# Patient Record
Sex: Female | Born: 1985
Health system: Southern US, Community
[De-identification: ages and names within clinical notes are randomized; demographics above are authoritative.]

## PROBLEM LIST (undated history)

## (undated) ENCOUNTER — Emergency Department: Discharge status: Absent without leave | Payer: Managed Care, Other (non HMO)

## (undated) DIAGNOSIS — R6250 Unspecified lack of expected normal physiological development in childhood: Secondary | ICD-10-CM

## (undated) DIAGNOSIS — T7840XA Allergy, unspecified, initial encounter: Secondary | ICD-10-CM

## (undated) DIAGNOSIS — F32A Depression, unspecified: Secondary | ICD-10-CM

## (undated) DIAGNOSIS — F329 Major depressive disorder, single episode, unspecified: Secondary | ICD-10-CM

## (undated) HISTORY — DX: Allergy, unspecified, initial encounter: T78.40XA

## (undated) HISTORY — DX: Unspecified lack of expected normal physiological development in childhood: R62.50

## (undated) HISTORY — DX: Depression, unspecified: F32.A

## (undated) HISTORY — DX: Major depressive disorder, single episode, unspecified: F32.9

---

## 2003-01-01 ENCOUNTER — Encounter: Payer: Self-pay | Admitting: Family Medicine

## 2006-10-19 ENCOUNTER — Encounter: Payer: Self-pay | Admitting: Family Medicine

## 2009-03-03 ENCOUNTER — Encounter: Payer: Self-pay | Admitting: Family Medicine

## 2009-12-14 ENCOUNTER — Encounter: Payer: Self-pay | Admitting: Family Medicine

## 2009-12-29 ENCOUNTER — Ambulatory Visit: Payer: Self-pay | Admitting: Family Medicine

## 2009-12-29 DIAGNOSIS — J309 Allergic rhinitis, unspecified: Secondary | ICD-10-CM | POA: Insufficient documentation

## 2009-12-29 DIAGNOSIS — F329 Major depressive disorder, single episode, unspecified: Secondary | ICD-10-CM

## 2009-12-29 DIAGNOSIS — L309 Dermatitis, unspecified: Secondary | ICD-10-CM | POA: Insufficient documentation

## 2009-12-29 DIAGNOSIS — F418 Other specified anxiety disorders: Secondary | ICD-10-CM | POA: Insufficient documentation

## 2009-12-29 DIAGNOSIS — F09 Unspecified mental disorder due to known physiological condition: Secondary | ICD-10-CM | POA: Insufficient documentation

## 2009-12-29 DIAGNOSIS — F3289 Other specified depressive episodes: Secondary | ICD-10-CM | POA: Insufficient documentation

## 2010-02-23 NOTE — Assessment & Plan Note (Signed)
Summary: NEW PT TO EST/CLE   Vital Signs:  Patient profile:   25 year old female Height:      63.5 inches Weight:      153.0 pounds BMI:     26.77 Temp:     98.7 degrees F oral Pulse rate:   72 / minute Pulse rhythm:   regular BP sitting:   110 / 70  (left arm) Cuff size:   regular  Vitals Entered By: Benny Lennert CMA Duncan Dull) (December 29, 2009 9:48 AM)  History of Present Illness: Chief complaint new patient to be established    Diagnosed at young age, age 61 with ... brain developmental injury.. did not get adequate blood when in womb or as a child. Has been recieveing  special assistance. Has recieved Occupation degree from [employer].  Works at [employer] 4 times a week...goes to [facility] classes for adults with disabilities. Low IQ Gross and fine motor skills and diminished some.  Some mild depression...lonely at times, knows she is different. Has not needed medicaiton.  Seeing counsleor every 3 weeks.  Some fatigue, some weight gain in last few years.   Allergies (verified): No Known Drug Allergies  Past History:  Past Medical History: Current Problems:  DEPRESSION (ICD-311)    Past Surgical History: oral surgery wisdom teeth removed 10-23-2009  Family History: mother: arhtritis, renal insufficiency, osteoporosis, early menopause Age 15 father: high cholesterol, GERD sister: high blood pressure mother: ovarian cancer,  died age 29  father: lung cancer and colon cancer, age 73 died  Social History: Occupation: Works Systems developer at [employer].  Going to [employer] for adults with disabilities Exercsing 4 times a week... Healthy eating..fruit and occ veggies, water, limited milkOccupation:  employed  Review of Systems General:  Complains of fatigue; denies fever. CV:  Denies chest pain or discomfort. Resp:  Denies shortness of breath. GI:  Denies abdominal pain. GU:  Denies dysuria.  Physical Exam  General:  overweight female in NAD  Ears:  External  ear exam shows no significant lesions or deformities.  Otoscopic examination reveals clear canals, tympanic membranes are intact bilaterally without bulging, retraction, inflammation or discharge. Hearing is grossly normal bilaterally. Nose:  External nasal examination shows no deformity or inflammation. Nasal mucosa are pink and moist without lesions or exudates. Mouth:  Oral mucosa and oropharynx without lesions or exudates.  Teeth in good repair. Neck:  no carotid bruit or thyromegaly no cervical or supraclavicular lymphadenopathy  Lungs:  Normal respiratory effort, chest expands symmetrically. Lungs are clear to auscultation, no crackles or wheezes. Heart:  Normal rate and regular rhythm. S1 and S2 normal without gallop, murmur, click, rub or other extra sounds. Abdomen:  Bowel sounds positive,abdomen soft and non-tender without masses, organomegaly or hernias noted. Pulses:  R and L posterior tibial pulses are full and equal bilaterally  Extremities:  no edema Skin:  Intact without suspicious lesions or rashes Psych:  Oriented X3, subdued, and poor eye contact.   Mimially interactive, but able to answer clear simple questions.    Impression & Recommendations:  Problem # 1:  DEVELOPMENTAL DELAY (ICD-315.9) Reviewed previous records and her current abilities. She stays very active with work and school.   Problem # 2:  DEPRESSION (ICD-311) Mild. Encouraged her mother to look into supplemental activities for her...info on social services given. Continue counsleing, no medication needed.   Complete Medication List: 1)  Cetirizine Hcl 10 Mg Tabs (Cetirizine hcl) .... One tablet daily 2)  Tri-previfem  0.18/0.215/0.25 Mg-35 Mcg Tabs (Norgestim-eth estrad triphasic) .... One tablet daily 3)  Fluticasone Propionate 50 Mcg/act Susp (Fluticasone propionate) .... As needed 4)  Naproxen Sodium 275 Mg Tabs (Naproxen sodium) .... As needed  Patient Instructions: 1)  Start multivitamin. 2)  Caclium  and vit D 600 mg, 400IU daily. 3)   Make appt for CPX in February 2012.    Orders Added: 1)  New Patient Level II [99202]    Current Allergies (reviewed today): No known allergies   Flu Vaccine Result Date:  11/24/2009 Flu Vaccine Result:  given Flu Vaccine Next Due:  1 yr TD Result Date:  01/25/1996 TD Result:  given TD Next Due:  10 yr PAP Result Date:  12/29/2009 PAP Result:  normal PAP Next Due:  2 yr

## 2010-02-23 NOTE — Letter (Signed)
Summary: '05,07'08/[facility]  '05,07'08/[facility]   Imported By: Lester Semmes 12/14/2009 12:15:57  _____________________________________________________________________  External Attachment:    Type:   Image     Comment:   External Document

## 2010-02-23 NOTE — Letter (Signed)
Summary: Growth Chart/[facility]  Growth Chart/[facility]   Imported By: Lester Weston 12/14/2009 12:20:48  _____________________________________________________________________  External Attachment:    Type:   Image     Comment:   External Document

## 2010-02-23 NOTE — Miscellaneous (Signed)
Summary: [facility]   Imported By: Lester Whiteland 12/14/2009 12:22:42  _____________________________________________________________________  External Attachment:    Type:   Image     Comment:   External Document

## 2010-06-14 LAB — HM PAP SMEAR: HM Pap smear: NORMAL

## 2012-06-13 ENCOUNTER — Ambulatory Visit (INDEPENDENT_AMBULATORY_CARE_PROVIDER_SITE_OTHER): Payer: BC Managed Care – PPO | Admitting: Internal Medicine

## 2012-06-13 ENCOUNTER — Encounter: Payer: Self-pay | Admitting: Internal Medicine

## 2012-06-13 VITALS — BP 112/68 | HR 55 | Temp 97.4°F | Resp 14 | Ht 63.0 in | Wt 152.0 lb

## 2012-06-13 DIAGNOSIS — L738 Other specified follicular disorders: Secondary | ICD-10-CM

## 2012-06-13 DIAGNOSIS — Z6825 Body mass index (BMI) 25.0-25.9, adult: Secondary | ICD-10-CM

## 2012-06-13 DIAGNOSIS — L853 Xerosis cutis: Secondary | ICD-10-CM

## 2012-06-13 DIAGNOSIS — R6250 Unspecified lack of expected normal physiological development in childhood: Secondary | ICD-10-CM

## 2012-06-13 DIAGNOSIS — E663 Overweight: Secondary | ICD-10-CM

## 2012-06-13 MED ORDER — FLUTICASONE PROPIONATE 50 MCG/ACT NA SUSP: 2.0000 | Freq: Every day | NASAL | Status: DC

## 2012-06-13 MED ORDER — CETIRIZINE HCL 10 MG PO TABS: 10.0000 mg | ORAL_TABLET | Freq: Every day | ORAL | Status: DC

## 2012-06-13 MED ORDER — NORGESTIM-ETH ESTRAD TRIPHASIC 0.18/0.215/0.25 MG-35 MCG PO TABS: ORAL_TABLET | ORAL | Status: DC

## 2012-06-13 NOTE — Progress Notes (Signed)
Patient ID: Ashlee Graves, female   DOB: 1985/03/30, 27 y.o.   MRN: [id number]   Patient Active Problem List   Diagnosis Date Noted  . Xerosis of skin 06/14/2012  . Overweight (BMI 25.0-29.9) 06/14/2012  . DEPRESSION 12/29/2009  . DEVELOPMENTAL DELAY 12/29/2009  . ALLERGIC RHINITIS 12/29/2009  . DERMATITIS, SEBORRHEIC 12/29/2009    Subjective:  CC:   Chief Complaint  Patient presents with  . Establish Care    HPI:   Ashlee Graves is a 27 y.o. female who presents as a new patient to establish primary care with the chief complaint of  New patient ,  Formerly Dr. Threasa Beards patient.   Referred by Jonita Albee,  Who  works as an NP at [employer].     Takes zurtec for allergic rhinitis but allergies have gotten worse and she is having itching.  Not worse after a bath or shower .  Actually gets relief with bathing., has not changed detergents    She has been learning disabled since  birth,  Gross motor skills and learning skills delayed .  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   Goes to [employer] [facility] [employer]  For adults with disability.  Hx provided by mom    History of heavy cramping and bleeding controlled with OCPs,  Patient has no history of sexual activity.  Had the Gardasil vaccine.    Has a preoccupation with weight and currently unahppy and wants to lose weight.    Mom notes loss of self control if at a buffet.  Weight fluctuates between current wt and 130 self initiated with dieting .  No bulemia.  She used to run but having some foot drop, had a fall down the stairs.  Tripped [place] board [place] the beach and fracture a toe.   Exercises for 40 minutes 4 days per week    Past Medical History  Diagnosis Date  . Depression   . Allergy   . Developmental delay, moderate     History reviewed. No pertinent past surgical history.  Family History  Problem Relation Age of Onset  . Hypertension Father   . Hyperlipidemia  Father   . Cancer Maternal Grandmother   . Cancer Maternal Grandfather   . Cancer Paternal Grandfather     History   Social History  . Marital Status: Single    Spouse Name: N/A    Number of Children: N/A  . Years of Education: N/A   Occupational History  . Not on file.   Social History Main Topics  . Smoking status: Never Smoker   . Smokeless tobacco: Never Used  . Alcohol Use: Yes     Comment: occasionally  . Drug Use: No  . Sexually Active: Not on file   Other Topics Concern  . Not on file   Social History Narrative  . No narrative on file        Review of Systems:   Patient denies headache, fevers, malaise, unintentional weight loss, skin rash, eye pain, sinus congestion and sinus pain, sore throat, dysphagia,  hemoptysis , cough, dyspnea, wheezing, chest pain, palpitations, orthopnea, edema, abdominal pain, nausea, melena, diarrhea, constipation, flank pain, dysuria, hematuria, urinary  Frequency, nocturia, numbness, tingling, seizures,  Focal weakness, Loss of consciousness,  Tremor, insomnia, depression, anxiety, and suicidal ideation.     Objective:  BP 112/68  Pulse 55  Temp(Src) 97.4 F (36.3 C) (Oral)  Resp 14  Ht 5\' 3"  (1.6 m)  Wt 152 lb (68.947 kg)  BMI 26.93 kg/m2  SpO2 96%  LMP 06/13/2012  General appearance: alert, cooperative and appears stated age Ears: normal TM's and external ear canals both ears Throat: lips, mucosa, and tongue normal; teeth and gums normal Neck: no adenopathy, no carotid bruit, supple, symmetrical, trachea midline and thyroid not enlarged, symmetric, no tenderness/mass/nodules Back: symmetric, no curvature. ROM normal. No CVA tenderness. Lungs: clear to auscultation bilaterally Heart: regular rate and rhythm, S1, S2 normal, no murmur, click, rub or gallop Abdomen: soft, non-tender; bowel sounds normal; no masses,  no organomegaly Pulses: 2+ and symmetric Skin: Skin color, texture, turgor normal. No rashes or  lesions Lymph nodes: Cervical, supraclavicular, and axillary nodes normal.  Assessment and Plan:  Xerosis of skin No rash seen.  Soaps and moisturizers discussed.  Aveeno or Eucerin recommended,  Screen for DM and thyroids   DEVELOPMENTAL DELAY Lives with parents  works AT [employer] and attends adult daycare   Overweight (BMI 25.0-29.9) I have addressed  BMI and recommended a low glycemic index diet utilizing smaller more frequent meals to increase metabolism.  I have also recommended that patient start exercising with a goal of 30 minutes of aerobic exercise a minimum of 5 days per week. Screening for lipid disorders, thyroid and diabetes to be done today.     Updated Medication List Outpatient Encounter Prescriptions as of 06/13/2012  Medication Sig Dispense Refill  . cetirizine (ZYRTEC) 10 MG tablet Take 1 tablet (10 mg total) by mouth daily.  90 tablet  3  . fluticasone (FLONASE) 50 MCG/ACT nasal spray Place 2 sprays into the nose daily.  16 g  11  . Norgestimate-Ethinyl Estradiol Triphasic (ORTHO TRI-CYCLEN, 28,) 0.18/0.215/0.25 MG-35 MCG tablet 1 tablet daily  1 Package  11  . [DISCONTINUED] cetirizine (ZYRTEC) 10 MG tablet Take 10 mg by mouth daily.      . [DISCONTINUED] fluticasone (FLONASE) 50 MCG/ACT nasal spray Place 2 sprays into the nose daily.      . [DISCONTINUED] Norgestim-Eth Estrad Triphasic (ORTHO TRI-CYCLEN, 28, PO) Take 1 tablet by mouth daily.       No facility-administered encounter medications on file as of 06/13/2012.     Orders Placed This Encounter  Procedures  . HM PAP SMEAR    No Follow-up on file.

## 2012-06-13 NOTE — Patient Instructions (Addendum)
Avoid perfumed soaps and moisturizers because they actually dry out the skin.  Ask Tammy if she can draw labs (CMET,  CBC w diff,  TSH.,  Fasting lipid,  ) if not,  Make appt for fasting labs.    Try adding a daily moisturizer to your routine to see if the itching improves (Aveeno and Eucerin )   Try eating strawberries, raspberries and blackberries with whipped cream but no cake!  Much better than bananas, watermelon pineapple and grapes.    This is  One version of a  "Low GI"  Diet:  It will still lower your blood sugars and allow you to lose 4 to 8  lbs  per month if you follow it carefully.  Your goal with exercise is a minimum of 30 minutes of aerobic exercise 5 days per week (Walking does not count once it becomes easy!)    All of the foods can be found at grocery stores and in bulk at [employer].  The Atkins protein bars and shakes are available in more varieties at Target, WalMart and Lowe's Foods.     7 AM Breakfast:  Choose from the following:  Low carbohydrate Protein  Shakes (I recommend the EAS AdvantEdge "Carb Control" shakes  Or the low carb shakes by Atkins.    2.5 carbs   Arnold's "Sandwhich Thin"toasted  w/ peanut butter (no jelly: about 20 net carbs  "Bagel Thin" with cream cheese and salmon: about 20 carbs   a scrambled egg/bacon/cheese burrito made with Mission's "carb balance" whole wheat tortilla  (about 10 net carbs )   Avoid cereal and bananas, oatmeal and cream of wheat and grits. They are loaded with carbohydrates!   10 AM: high protein snack  Protein bar by Atkins (the snack size, under 200 cal, usually < 6 net carbs).    A stick of cheese:  Around 1 carb,  100 cal     Dannon Light n Fit [place] Yogurt  (80 cal, 8 carbs)  Other so called "protein bars" and Greek yogurts tend to be loaded with carbohydrates.  Remember, in food advertising, the word "energy" is synonymous for " carbohydrate."  Lunch:   A Sandwich using the bread choices listed, Can use any  Eggs,   lunchmeat, grilled meat or canned tuna), avocado, regular mayo/mustard  and cheese.  A Salad using blue cheese, ranch,  Goddess or vinagrette,  No croutons or "confetti" and no "candied nuts" but regular nuts OK.   No pretzels or chips.  Pickles and miniature sweet peppers are a good low carb alternative that provide a "crunch"  The bread is the only source of carbohydrate in a sandwich and  can be decreased by trying some of these alternatives to traditional loaf bread  Joseph's makes a pita bread and a flat bread that are 50 cal and 4 net carbs available at BJs and WalMart.  This can be toasted to use with hummous as well  Toufayan makes a low carb flatbread that's 100 cal and 9 net carbs available at [employer] and [employer] makes 2 sizes of  Low carb whole wheat tortilla  (The large one is 210 cal and 6 net carbs) Avoid "Low fat dressings, as well as Reyne Dumas and [address] dressings They are loaded with sugar!   3 PM/ Mid day  Snack:  Consider  1 ounce of  almonds, walnuts, pistachios, pecans, peanuts,  Macadamia nuts or a nut medley.  Avoid "granola"; the  dried cranberries and raisins are loaded with carbohydrates. Mixed nuts as long as there are no raisins,  cranberries or dried fruit.     6 PM  Dinner:     Meat/fowl/fish with a green salad, and either broccoli, cauliflower, green beans, spinach, brussel sprouts or  Lima beans. DO NOT BREAD THE PROTEIN!!      There is a low carb pasta by Dreamfield's that is acceptable and tastes great: only 5 digestible carbs/serving.( All grocery stores but BJs carry it )  Try Kai Levins Angelo's chicken piccata or chicken or eggplant parm over low carb pasta.(Lowes and BJs)   Clifton Custard Sanchez's "Carnitas" (pulled pork, no sauce,  0 carbs) or his beef pot roast to make a dinner burrito (at BJ's)  Pesto over low carb pasta (bj's sells a good quality pesto in the center refrigerated section of the deli   Whole wheat pasta is still full of digestible  carbs and  Not as low in glycemic index as Dreamfield's.   Brown rice is still rice,  So skip the rice and noodles if you eat [place] or [place] (or at least limit to 1/2 cup)  9 PM snack :   Breyer's "low carb" fudgsicle or  ice cream bar (Carb Smart line), or  Weight Watcher's ice cream bar , or another "no sugar added" ice cream;  a serving of fresh berries/cherries with whipped cream   Cheese or DANNON'S LlGHT N FIT GREEK YOGURT  Avoid bananas, pineapple, grapes  and watermelon on a regular basis because they are high in sugar.  THINK OF THEM AS DESSERT  Remember that snack Substitutions should be less than 10 NET carbs per serving and meals < 20 carbs. Remember to subtract fiber grams to get the "net carbs."

## 2012-06-14 ENCOUNTER — Encounter: Payer: Self-pay | Admitting: Internal Medicine

## 2012-06-14 DIAGNOSIS — R6250 Unspecified lack of expected normal physiological development in childhood: Secondary | ICD-10-CM | POA: Insufficient documentation

## 2012-06-14 DIAGNOSIS — E669 Obesity, unspecified: Secondary | ICD-10-CM | POA: Insufficient documentation

## 2012-06-14 DIAGNOSIS — E66811 Obesity, class 1: Secondary | ICD-10-CM | POA: Insufficient documentation

## 2012-06-14 DIAGNOSIS — L981 Factitial dermatitis: Secondary | ICD-10-CM | POA: Insufficient documentation

## 2012-06-14 DIAGNOSIS — E663 Overweight: Secondary | ICD-10-CM | POA: Insufficient documentation

## 2012-06-14 HISTORY — DX: Obesity, class 1: E66.811

## 2012-06-14 NOTE — Assessment & Plan Note (Signed)
Lives with parents  works AT [employer] and attends adult daycare

## 2012-06-14 NOTE — Assessment & Plan Note (Signed)
No rash seen.  Soaps and moisturizers discussed.  Aveeno or Eucerin recommended,  Screen for DM and thyroids

## 2012-06-14 NOTE — Assessment & Plan Note (Signed)
I have addressed  BMI and recommended a low glycemic index diet utilizing smaller more frequent meals to increase metabolism.  I have also recommended that patient start exercising with a goal of 30 minutes of aerobic exercise a minimum of 5 days per week. Screening for lipid disorders, thyroid and diabetes to be done today.   

## 2012-07-06 LAB — CBC AND DIFFERENTIAL
HEMATOCRIT: 37 % (ref 36–46)
HEMOGLOBIN: 12.7 g/dL (ref 12.0–16.0)
Platelets: 280 10*3/uL (ref 150–399)
WBC: 6.3 10^3/mL

## 2012-07-06 LAB — LIPID PANEL
Cholesterol: 158 mg/dL (ref 0–200)
HDL: 56 mg/dL (ref 35–70)
LDL CALC: 89 mg/dL
TRIGLYCERIDES: 65 mg/dL (ref 40–160)

## 2012-07-06 LAB — HEPATIC FUNCTION PANEL
ALT: 10 U/L (ref 7–35)
AST: 19 U/L (ref 13–35)
Alkaline Phosphatase: 40 U/L (ref 25–125)
Bilirubin, Total: 0.3 mg/dL

## 2012-07-06 LAB — TSH: TSH: 4.45 u[IU]/mL (ref 0.41–5.90)

## 2012-07-06 LAB — BASIC METABOLIC PANEL
BUN: 9 mg/dL (ref 4–21)
Creatinine: 0.6 mg/dL (ref 0.5–1.1)
Glucose: 78 mg/dL
POTASSIUM: 4.4 mmol/L (ref 3.4–5.3)
Sodium: 137 mmol/L (ref 137–147)

## 2012-07-12 ENCOUNTER — Encounter: Payer: Self-pay | Admitting: *Deleted

## 2012-07-12 ENCOUNTER — Telehealth: Payer: Self-pay | Admitting: Internal Medicine

## 2012-07-12 NOTE — Telephone Encounter (Signed)
All labs normal,. Including thyroid function, CBC, Vit D  and cholesterol

## 2012-07-12 NOTE — Telephone Encounter (Signed)
Letter mailed

## 2013-05-04 ENCOUNTER — Other Ambulatory Visit: Payer: Self-pay | Admitting: Internal Medicine

## 2013-05-06 NOTE — Telephone Encounter (Signed)
Appt 06/21/13 

## 2013-06-13 ENCOUNTER — Encounter: Payer: Medicare Other | Admitting: Internal Medicine

## 2013-06-21 ENCOUNTER — Encounter: Payer: Self-pay | Admitting: Internal Medicine

## 2013-06-21 ENCOUNTER — Ambulatory Visit (INDEPENDENT_AMBULATORY_CARE_PROVIDER_SITE_OTHER): Payer: BC Managed Care – PPO | Admitting: Internal Medicine

## 2013-06-21 ENCOUNTER — Telehealth: Payer: Self-pay | Admitting: Internal Medicine

## 2013-06-21 VITALS — BP 130/88 | HR 75 | Temp 97.5°F | Resp 16 | Ht 63.0 in | Wt 154.8 lb

## 2013-06-21 DIAGNOSIS — Z0000 Encounter for general adult medical examination without abnormal findings: Secondary | ICD-10-CM

## 2013-06-21 DIAGNOSIS — J309 Allergic rhinitis, unspecified: Secondary | ICD-10-CM

## 2013-06-21 DIAGNOSIS — F411 Generalized anxiety disorder: Secondary | ICD-10-CM

## 2013-06-21 MED ORDER — AZELASTINE HCL 0.05 % OP SOLN: 1.0000 [drp] | Freq: Two times a day (BID) | OPHTHALMIC | Status: DC

## 2013-06-21 MED ORDER — NORGESTIM-ETH ESTRAD TRIPHASIC 0.18/0.215/0.25 MG-35 MCG PO TABS: ORAL_TABLET | ORAL | Status: DC

## 2013-06-21 MED ORDER — SERTRALINE HCL 50 MG PO TABS: 50.0000 mg | ORAL_TABLET | Freq: Every day | ORAL | Status: DC

## 2013-06-21 MED ORDER — FEXOFENADINE HCL 180 MG PO TABS: 180.0000 mg | ORAL_TABLET | Freq: Every day | ORAL | Status: DC

## 2013-06-21 NOTE — Patient Instructions (Signed)
For your eye allergies,  I have prescribed azelastine drops  To use twice daily  We are also switching from zyrtec to allegra (generic )     Trial of generic zyrtec for anxiety and compulsivity. Start with 1/2 tablet daily in the evneing,  Increase to full tablet after 4 to 6 days

## 2013-06-21 NOTE — Telephone Encounter (Signed)
FYI Pt saw dr Derrel Nip today 06/21/13.  Dr Derrel Nip wanted 4 week follow up Due to being out of town  Caregiver made appointment for 08/09/13

## 2013-06-21 NOTE — Progress Notes (Signed)
Patient ID: Ashlee Graves, female   DOB: [birth year], 28 y.o.   MRN: [id number]   Subjective:     Ashlee Graves is a 27 y.o. female and is here for a comprehensive physical exam. The patient reports problems - as follows: 1) not sexually active  2) allergic rhinitis and conjunctivitis depsite regular use of flonase and zyrtec  3)  Aggressive behavior:  Per her mother,  She has been  stressed out by relationship with her older  sister Ashlee Graves, who has a new boyfriend that [name] does not like /is jealous of.   Sees a counselor Ashlee Graves,  More frequently , but patient's mother wants to discuss ssri therapy bc she has seen her go into a near panic attack and start scratching her arms,  Some complusive and mean spirited behavior.     History   Social History  . Marital Status: Single    Spouse Name: N/A    Number of Children: N/A  . Years of Education: N/A   Occupational History  . Not on file.   Social History Main Topics  . Smoking status: Never Smoker   . Smokeless tobacco: Never Used  . Alcohol Use: Yes     Comment: occasionally  . Drug Use: No  . Sexual Activity: Not on file   Other Topics Concern  . Not on file   Social History Narrative  . No narrative on file   Health Maintenance  Topic Date Due  . Tetanus/tdap  03/06/2002  . Pap Smear  06/13/2013  . Influenza Vaccine  08/24/2013    The following portions of the patient's history were reviewed and updated as appropriate: allergies, current medications, past family history, past medical history, past social history, past surgical history and problem list.  Review of Systems A comprehensive review of systems was negative.   Objective:   BP 130/88  Pulse 75  Temp(Src) 97.5 F (36.4 C) (Oral)  Resp 16  Ht 5\' 3"  (1.6 m)  Wt 154 lb 12 oz (70.194 kg)  BMI 27.42 kg/m2  SpO2 99%  LMP 06/10/2013  General appearance: alert, cooperative and appears stated age Head: Normocephalic, without obvious abnormality,  atraumatic Eyes: conjunctivae/corneas clear. PERRL, EOM's intact. Fundi benign. Ears: normal TM's and external ear canals both ears Nose: Nares normal. Septum midline. Mucosa normal. No drainage or sinus tenderness. Throat: lips, mucosa, and tongue normal; teeth and gums normal Neck: no adenopathy, no carotid bruit, no JVD, supple, symmetrical, trachea midline and thyroid not enlarged, symmetric, no tenderness/mass/nodules Lungs: clear to auscultation bilaterally Breasts: normal appearance, no masses or tenderness Heart: regular rate and rhythm, S1, S2 normal, no murmur, click, rub or gallop Abdomen: soft, non-tender; bowel sounds normal; no masses,  no organomegaly Extremities: extremities normal, atraumatic, no cyanosis or edema Pulses: 2+ and symmetric Skin: Skin color, texture, turgor normal. No rashes or lesions Neurologic: Alert and oriented X 3, normal strength and tone. Normal symmetric reflexes. Normal coordination and gait.   .    Assessment and Plan:   ALLERGIC RHINITIS Adding azelastine eye drops and allegra. Continue flonase  Anxiety state, unspecified Discussed trial of zoloft for her compulsive, aggressive behavior. Return in one month   Visit for preventive health examination Annual comprehensive exam was done including breast, excluding  pelvic and PAP smear. All screenings have been addressed .    Updated Medication List Outpatient Encounter Prescriptions as of 06/21/2013  Medication Sig  . fluticasone (FLONASE) 50 MCG/ACT nasal spray Place 2 sprays into the  nose daily.  . Norgestimate-Ethinyl Estradiol Triphasic (TRI-PREVIFEM) 0.18/0.215/0.25 MG-35 MCG tablet TAKE 1 TABLET BY MOUTH EVERY DAY  . [DISCONTINUED] cetirizine (ZYRTEC) 10 MG tablet Take 1 tablet (10 mg total) by mouth daily.  . [DISCONTINUED] TRI-PREVIFEM 0.18/0.215/0.25 MG-35 MCG tablet TAKE 1 TABLET BY MOUTH EVERY DAY  . azelastine (OPTIVAR) 0.05 % ophthalmic solution Place 1 drop into both eyes 2  (two) times daily.  . fexofenadine (ALLEGRA) 180 MG tablet Take 1 tablet (180 mg total) by mouth daily.  . sertraline (ZOLOFT) 50 MG tablet Take 1 tablet (50 mg total) by mouth daily.

## 2013-06-21 NOTE — Progress Notes (Signed)
Pre-visit discussion using our clinic review tool. No additional management support is needed unless otherwise documented below in the visit note.  

## 2013-06-23 ENCOUNTER — Encounter: Payer: Self-pay | Admitting: Internal Medicine

## 2013-06-23 DIAGNOSIS — Z124 Encounter for screening for malignant neoplasm of cervix: Secondary | ICD-10-CM | POA: Insufficient documentation

## 2013-06-23 DIAGNOSIS — F411 Generalized anxiety disorder: Secondary | ICD-10-CM | POA: Insufficient documentation

## 2013-06-23 NOTE — Assessment & Plan Note (Signed)
Annual comprehensive exam was done including breast, excluding pelvic and PAP smear. All screenings have been addressed .  

## 2013-06-23 NOTE — Assessment & Plan Note (Signed)
Adding azelastine eye drops and allegra. Continue flonase

## 2013-06-23 NOTE — Assessment & Plan Note (Signed)
Discussed trial of zoloft for her compulsive, aggressive behavior. Return in one month

## 2013-06-28 ENCOUNTER — Other Ambulatory Visit: Payer: Self-pay | Admitting: Internal Medicine

## 2013-06-28 LAB — CBC AND DIFFERENTIAL
HCT: 38 % (ref 36–46)
Hemoglobin: 12.5 g/dL (ref 12.0–16.0)
Neutrophils Absolute: 4 /uL
Platelets: 290 10*3/uL (ref 150–399)
WBC: 7.2 10^3/mL

## 2013-06-28 LAB — BASIC METABOLIC PANEL
BUN: 11 mg/dL (ref 4–21)
CREATININE: 0.6 mg/dL (ref 0.5–1.1)
Glucose: 77 mg/dL
Potassium: 4.7 mmol/L (ref 3.4–5.3)
Sodium: 137 mmol/L (ref 137–147)

## 2013-06-28 LAB — LIPID PANEL
CHOLESTEROL: 165 mg/dL (ref 0–200)
HDL: 58 mg/dL (ref 35–70)
LDL Cholesterol: 96 mg/dL
Triglycerides: 56 mg/dL (ref 40–160)

## 2013-06-28 LAB — HEPATIC FUNCTION PANEL
ALK PHOS: 45 U/L (ref 25–125)
ALT: 9 U/L (ref 7–35)
AST: 17 U/L (ref 13–35)
Bilirubin, Total: 0.2 mg/dL

## 2013-06-28 LAB — TSH: TSH: 2.24 u[IU]/mL (ref 0.41–5.90)

## 2013-07-07 ENCOUNTER — Other Ambulatory Visit: Payer: Self-pay | Admitting: Internal Medicine

## 2013-08-09 ENCOUNTER — Ambulatory Visit: Payer: Medicare Other | Admitting: Internal Medicine

## 2013-08-12 ENCOUNTER — Ambulatory Visit (INDEPENDENT_AMBULATORY_CARE_PROVIDER_SITE_OTHER): Payer: BC Managed Care – PPO | Admitting: Internal Medicine

## 2013-08-12 ENCOUNTER — Encounter: Payer: Self-pay | Admitting: Internal Medicine

## 2013-08-12 VITALS — BP 128/92 | HR 65 | Temp 98.5°F | Resp 16 | Ht 63.0 in | Wt 161.8 lb

## 2013-08-12 DIAGNOSIS — F411 Generalized anxiety disorder: Secondary | ICD-10-CM

## 2013-08-12 DIAGNOSIS — R638 Other symptoms and signs concerning food and fluid intake: Secondary | ICD-10-CM

## 2013-08-12 DIAGNOSIS — T50905A Adverse effect of unspecified drugs, medicaments and biological substances, initial encounter: Secondary | ICD-10-CM

## 2013-08-12 DIAGNOSIS — R635 Abnormal weight gain: Secondary | ICD-10-CM

## 2013-08-12 NOTE — Progress Notes (Signed)
Pre-visit discussion using our clinic review tool. No additional management support is needed unless otherwise documented below in the visit note.  

## 2013-08-12 NOTE — Patient Instructions (Addendum)
You   are doing well,  But I want you to stop sneaking food!!!    I want you to spend 45 minutes every day on the [place] After work!  4 days per week  The only snacks you should have are fruit and yogurt    Try making a parfait of the following ingredients:  Blueberries Dannon Lt n Fit Greek yogurt Whipped  Cream  Really yummy!!  Very good for you   I would like you ot lose 5 lbs by the next time I see you in 2 months

## 2013-08-12 NOTE — Progress Notes (Signed)
Patient ID: Ashlee Graves, female   DOB: [birth year], 28 y.o.   MRN: [id number]   Patient Active Problem List   Diagnosis Date Noted  . Weight gain due to medication 08/13/2013  . Anxiety state, unspecified 06/23/2013  . Visit for preventive health examination 06/23/2013  . Xerosis of skin 06/14/2012  . Overweight (BMI 25.0-29.9) 06/14/2012  . DEPRESSION 12/29/2009  . DEVELOPMENTAL DELAY 12/29/2009  . ALLERGIC RHINITIS 12/29/2009  . DERMATITIS, SEBORRHEIC 12/29/2009    Subjective:  CC:   Chief Complaint  Patient presents with  . Follow-up    4 week follow up on zoloft and weight    HPI:   Ashlee Graves is a 28 y.o. female who presents for 4 week follow up on SSRI initiation  Of therapy for hostile/aggressive behavior towards sister's boyfriend,.  She is accompanied by her mother.  Her behavioral issues have resolved and her mother is very pleased with her demeanor .  She is taking just 25 mg zoloft.  No more hostility and acting out.  However, she has gained weight since starting the medication.  She frequently sneaks food  And eating several additional meals daily.    Past Medical History  Diagnosis Date  . Depression   . Allergy   . Developmental delay, moderate     History reviewed. No pertinent past surgical history.     The following portions of the patient's history were reviewed and updated as appropriate: Allergies, current medications, and problem list.    Review of Systems:   Patient denies headache, fevers, malaise, unintentional weight loss, skin rash, eye pain, sinus congestion and sinus pain, sore throat, dysphagia,  hemoptysis , cough, dyspnea, wheezing, chest pain, palpitations, orthopnea, edema, abdominal pain, nausea, melena, diarrhea, constipation, flank pain, dysuria, hematuria, urinary  Frequency, nocturia, numbness, tingling, seizures,  Focal weakness, Loss of consciousness,  Tremor, insomnia, depression, anxiety, and suicidal ideation.      History   Social History  . Marital Status: Single    Spouse Name: N/A    Number of Children: N/A  . Years of Education: N/A   Occupational History  . Not on file.   Social History Main Topics  . Smoking status: Never Smoker   . Smokeless tobacco: Never Used  . Alcohol Use: Yes     Comment: occasionally  . Drug Use: No  . Sexual Activity: Not on file   Other Topics Concern  . Not on file   Social History Narrative  . No narrative on file    Objective:  Filed Vitals:   08/12/13 1714  BP: 128/92  Pulse: 65  Temp: 98.5 F (36.9 C)  Resp: 16     General appearance: alert, cooperative and appears stated age Ears: normal TM's and external ear canals both ears Throat: lips, mucosa, and tongue normal; teeth and gums normal Neck: no adenopathy, no carotid bruit, supple, symmetrical, trachea midline and thyroid not enlarged, symmetric, no tenderness/mass/nodules Back: symmetric, no curvature. ROM normal. No CVA tenderness. Lungs: clear to auscultation bilaterally Heart: regular rate and rhythm, S1, S2 normal, no murmur, click, rub or gallop Abdomen: soft, non-tender; bowel sounds normal; no masses,  no organomegaly Pulses: 2+ and symmetric Skin: Skin color, texture, turgor normal. No rashes or lesions Lymph nodes: Cervical, supraclavicular, and axillary nodes normal.  Assessment and Plan:  Anxiety state, unspecified improved with low dose zoloft ; no changes today  Weight gain due to medication Secondary to zoloft.  counseling given.,  Low glycemic index diet  recommended,    Updated Medication List Outpatient Encounter Prescriptions as of 08/12/2013  Medication Sig  . azelastine (OPTIVAR) 0.05 % ophthalmic solution Place 1 drop into both eyes 2 (two) times daily.  . fexofenadine (ALLEGRA) 180 MG tablet Take 1 tablet (180 mg total) by mouth daily.  . fluticasone (FLONASE) 50 MCG/ACT nasal spray Place 2 sprays into the nose daily.  . Norgestimate-Ethinyl  Estradiol Triphasic (TRI-PREVIFEM) 0.18/0.215/0.25 MG-35 MCG tablet TAKE 1 TABLET BY MOUTH EVERY DAY  . sertraline (ZOLOFT) 50 MG tablet Take 1 tablet (50 mg total) by mouth daily.  . cetirizine (ZYRTEC) 10 MG tablet TAKE 1 TABLET EVERY DAY     No orders of the defined types were placed in this encounter.    Return in about 2 months (around 10/13/2013).

## 2013-08-13 ENCOUNTER — Encounter: Payer: Self-pay | Admitting: Internal Medicine

## 2013-08-13 DIAGNOSIS — F509 Eating disorder, unspecified: Secondary | ICD-10-CM | POA: Insufficient documentation

## 2013-08-13 NOTE — Assessment & Plan Note (Signed)
Secondary to zoloft.  counseling given.,  Low glycemic index diet recommended,

## 2013-08-13 NOTE — Assessment & Plan Note (Signed)
improved with low dose zoloft ; no changes today

## 2013-08-15 ENCOUNTER — Other Ambulatory Visit: Payer: Self-pay | Admitting: Internal Medicine

## 2013-09-10 ENCOUNTER — Other Ambulatory Visit: Payer: Self-pay | Admitting: Internal Medicine

## 2013-10-07 ENCOUNTER — Other Ambulatory Visit: Payer: Self-pay | Admitting: Internal Medicine

## 2013-10-21 ENCOUNTER — Ambulatory Visit (INDEPENDENT_AMBULATORY_CARE_PROVIDER_SITE_OTHER): Payer: BC Managed Care – PPO | Admitting: Internal Medicine

## 2013-10-21 ENCOUNTER — Encounter: Payer: Self-pay | Admitting: Internal Medicine

## 2013-10-21 VITALS — BP 138/84 | HR 64 | Temp 98.1°F | Resp 16 | Ht 63.0 in | Wt 159.8 lb

## 2013-10-21 DIAGNOSIS — L981 Factitial dermatitis: Secondary | ICD-10-CM

## 2013-10-21 DIAGNOSIS — F411 Generalized anxiety disorder: Secondary | ICD-10-CM

## 2013-10-21 DIAGNOSIS — E663 Overweight: Secondary | ICD-10-CM

## 2013-10-21 DIAGNOSIS — Z79899 Other long term (current) drug therapy: Secondary | ICD-10-CM

## 2013-10-21 MED ORDER — PHENTERMINE HCL 37.5 MG PO TABS: 37.5000 mg | ORAL_TABLET | Freq: Every day | ORAL | Status: DC

## 2013-10-21 NOTE — Progress Notes (Signed)
Pre-visit discussion using our clinic review tool. No additional management support is needed unless otherwise documented below in the visit note.  

## 2013-10-21 NOTE — Progress Notes (Signed)
Patient ID: Ashlee Graves, female   DOB: 1985-03-29, 28 y.o.   MRN: [id number]   Patient Active Problem List   Diagnosis Date Noted  . Weight gain due to medication 08/13/2013  . Generalized anxiety disorder 06/23/2013  . Visit for preventive health examination 06/23/2013  . Excoriation, neurotic 06/14/2012  . Overweight (BMI 25.0-29.9) 06/14/2012  . DEPRESSION 12/29/2009  . DEVELOPMENTAL DELAY 12/29/2009  . ALLERGIC RHINITIS 12/29/2009  . DERMATITIS, SEBORRHEIC 12/29/2009    Subjective:  CC:   Chief Complaint  Patient presents with  . Follow-up    weight    HPI:   Ashlee Graves is a 28 y.o. female who presents for Follow up on generalized anxiety disorder and overweight, complicated by eating disorder and learning disability, Patient is accompanied by her mother,  She has lost 32 lbs since her last visit but continues to struggle with overeating and hoarding of food.   Has episode of N/v Saturday night  After eating an [employer] chicken sandwhich. Started early Sunday morning, which Resolved by breakfast Sunday morning with no residual symptoms.     Occasional  headaches reported   Frontal location.  Often complains of them when she is confronted by noisy children.  .Denies blurred vision, sinus pain, auras, and nausea.   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  Denies chest pain and joint pain,  No history of syncope.    Continues to have episodes of anxiety brought on by confrontation with her sister who has moved in with her boyfriend and as a result has less time to spend with patient,  Mother notes that her outward sign of anxiety is  Neurotic itching resulting in scratching of arms and legs. Discussed increasing her zoloft from 25 mg to 50 mg    Past Medical History  Diagnosis Date  . Depression   . Allergy   . Developmental delay, moderate     History  reviewed. No pertinent past surgical history.     The following portions of the patient's history were reviewed and updated as appropriate: Allergies, current medications, and problem list.    Review of Systems:   Patient denies headache, fevers, malaise, unintentional weight loss, skin rash, eye pain, sinus congestion and sinus pain, sore throat, dysphagia,  hemoptysis , cough, dyspnea, wheezing, chest pain, palpitations, orthopnea, edema, abdominal pain, nausea, melena, diarrhea, constipation, flank pain, dysuria, hematuria, urinary  Frequency, nocturia, numbness, tingling, seizures,  Focal weakness, Loss of consciousness,  Tremor, insomnia, depression, anxiety, and suicidal ideation.     History   Social History  . Marital Status: Single    Spouse Name: N/A    Number of Children: N/A  . Years of Education: N/A   Occupational History  . Not on file.   Social History Main Topics  . Smoking status: Never Smoker   . Smokeless tobacco: Never Used  . Alcohol Use: Yes     Comment: occasionally  . Drug Use: No  . Sexual Activity: Not on file   Other Topics Concern  . Not on file   Social History Narrative  . No narrative on file    Objective:  Filed Vitals:   10/21/13 1706  BP: 138/84  Pulse: 64  Temp: 98.1 F (36.7 C)  Resp: 16     General appearance: alert, cooperative and appears stated age Ears: normal TM's  and external ear canals both ears Throat: lips, mucosa, and tongue normal; teeth and gums normal Neck: no adenopathy, no carotid bruit, supple, symmetrical, trachea midline and thyroid not enlarged, symmetric, no tenderness/mass/nodules Back: symmetric, no curvature. ROM normal. No CVA tenderness. Lungs: clear to auscultation bilaterally Heart: regular rate and rhythm, S1, S2 normal, no murmur, click, rub or gallop Abdomen: soft, non-tender; bowel sounds normal; no masses,  no organomegaly Pulses: 2+ and symmetric Skin: Skin color, texture, turgor  normal. No rashes or lesions Lymph nodes: Cervical, supraclavicular, and axillary nodes normal.  Assessment and Plan:  Generalized anxiety disorder improved with low dose zoloft ; discussed increasing dose to 50 mg to help control the neurotic itching,     Excoriation, neurotic No rash seen.  Soaps and moisturizers discussed.  Aveeno or Eucerin recommended, increase zoloft to 50 mg    Overweight (BMI 25.0-29.9) She has had difficulty losing weight due to increased appetite food hearding.  Discussed  trial of  Phentermine with her mother, [place] is her legal guargain due to developmental delay. .  She is aware of the possible side effects and risks and understands that    The medication will be discontinued if she has not lost 5% of her body weight over the next 3 months, which , based on today's weight is 14 lbs.   Updated Medication List Outpatient Encounter Prescriptions as of 10/21/2013  Medication Sig  . azelastine (OPTIVAR) 0.05 % ophthalmic solution Place 1 drop into both eyes 2 (two) times daily.  . fexofenadine (ALLEGRA) 180 MG tablet TAKE 1 TABLET (180 MG TOTAL) BY MOUTH DAILY.  . fluticasone (FLONASE) 50 MCG/ACT nasal spray Place 2 sprays into the nose daily.  . sertraline (ZOLOFT) 50 MG tablet TAKE 1 TABLET BY MOUTH ONCE A DAY  . TRI-PREVIFEM 0.18/0.215/0.25 MG-35 MCG tablet TAKE 1 TABLET BY MOUTH EVERY DAY  . phentermine (ADIPEX-P) 37.5 MG tablet Take 1 tablet (37.5 mg total) by mouth daily before breakfast.  . [DISCONTINUED] cetirizine (ZYRTEC) 10 MG tablet TAKE 1 TABLET EVERY DAY     No orders of the defined types were placed in this encounter.    No Follow-up on file.

## 2013-10-21 NOTE — Patient Instructions (Signed)
I have authorized the use of phentermine for 3 months.  Please have Ashlee Graves's vital signs checked a week after starting the medication , and return to see me in 3 months.  A normal BP is 130/80 or less,  A nomal pulse is 90 or less    Take 1/2 tablet in the morning.  You may add a second half by 2 PM (to avoid insomnia)   If you have not lost 5% of your starting weight by the end of the  3 months we will discontinue it.   Phentermine tablets or capsules What is this medicine? PHENTERMINE (FEN ter meen) decreases your appetite. It is used with a reduced calorie diet and exercise to help you lose weight. This medicine may be used for other purposes; ask your health care provider or pharmacist if you have questions. COMMON BRAND NAME(S): Adipex-P, Atti-Plex P, Atti-Plex P Spansule, Fastin, Pro-Fast, Tara-8 What should I tell my health care provider before I take this medicine? They need to know if you have any of these conditions: -agitation -glaucoma -heart disease -high blood pressure -history of substance abuse -lung disease called Primary Pulmonary Hypertension (PPH) -taken an MAOI like Carbex, Eldepryl, Marplan, Nardil, or Parnate in last 14 days -thyroid disease -an unusual or allergic reaction to phentermine, other medicines, foods, dyes, or preservatives -pregnant or trying to get pregnant -breast-feeding How should I use this medicine? Take this medicine by mouth with a glass of water. Follow the directions on the prescription label. This medicine is usually taken 30 minutes before or 1 to 2 hours after breakfast. Avoid taking this medicine in the evening. It may interfere with sleep. Take your doses at regular intervals. Do not take your medicine more often than directed. Talk to your pediatrician regarding the use of this medicine in children. Special care may be needed. Overdosage: If you think you have taken too much of this medicine contact a poison control center or emergency  room at once. NOTE: This medicine is only for you. Do not share this medicine with others. What if I miss a dose? If you miss a dose, take it as soon as you can. If it is almost time for your next dose, take only that dose. Do not take double or extra doses. What may interact with this medicine? Do not take this medicine with any of the following medications: -duloxetine -MAOIs like Carbex, Eldepryl, Marplan, Nardil, and Parnate -medicines for colds or breathing difficulties like pseudoephedrine or phenylephrine -procarbazine -sibutramine -SSRIs like citalopram, escitalopram, fluoxetine, fluvoxamine, paroxetine, and sertraline -stimulants like dexmethylphenidate, methylphenidate or modafinil -venlafaxine This medicine may also interact with the following medications: -medicines for diabetes This list may not describe all possible interactions. Give your health care provider a list of all the medicines, herbs, non-prescription drugs, or dietary supplements you use. Also tell them if you smoke, drink alcohol, or use illegal drugs. Some items may interact with your medicine. What should I watch for while using this medicine? Notify your physician immediately if you become short of breath while doing your normal activities. Do not take this medicine within 6 hours of bedtime. It can keep you from getting to sleep. Avoid drinks that contain caffeine and try to stick to a regular bedtime every night. This medicine was intended to be used in addition to a healthy diet and exercise. The best results are achieved this way. This medicine is only indicated for short-term use. Eventually your weight loss may level out. At that  point, the drug will only help you maintain your new weight. Do not increase or in any way change your dose without consulting your doctor. You may get drowsy or dizzy. Do not drive, use machinery, or do anything that needs mental alertness until you know how this medicine affects  you. Do not stand or sit up quickly, especially if you are an older patient. This reduces the risk of dizzy or fainting spells. Alcohol may increase dizziness and drowsiness. Avoid alcoholic drinks. What side effects may I notice from receiving this medicine? Side effects that you should report to your doctor or health care professional as soon as possible: -chest pain, palpitations -depression or severe changes in mood -increased blood pressure -irritability -nervousness or restlessness -severe dizziness -shortness of breath -problems urinating -unusual swelling of the legs -vomiting Side effects that usually do not require medical attention (report to your doctor or health care professional if they continue or are bothersome): -blurred vision or other eye problems -changes in sexual ability or desire -constipation or diarrhea -difficulty sleeping -dry mouth or unpleasant taste -headache -nausea This list may not describe all possible side effects. Call your doctor for medical advice about side effects. You may report side effects to FDA at 1-800-FDA-1088. Where should I keep my medicine? Keep out of the reach of children. This medicine can be abused. Keep your medicine in a safe place to protect it from theft. Do not share this medicine with anyone. Selling or giving away this medicine is dangerous and against the law. Store at room temperature between 20 and 25 degrees C (68 and 77 degrees F). Keep container tightly closed. Throw away any unused medicine after the expiration date. NOTE: This sheet is a summary. It may not cover all possible information. If you have questions about this medicine, talk to your doctor, pharmacist, or health care provider.  2015, [employer]. (2010-02-24 11:02:44)

## 2013-10-22 ENCOUNTER — Encounter: Payer: Self-pay | Admitting: Internal Medicine

## 2013-10-22 NOTE — Assessment & Plan Note (Signed)
improved with low dose zoloft ; discussed increasing dose to 50 mg to help control the neurotic itching,

## 2013-10-22 NOTE — Assessment & Plan Note (Signed)
No rash seen.  Soaps and moisturizers discussed.  Aveeno or Eucerin recommended, increase zoloft to 50 mg

## 2013-10-22 NOTE — Assessment & Plan Note (Signed)
She has had difficulty losing weight due to increased appetite food hearding.  Discussed  trial of  Phentermine with her mother, Ashlee Graves is her legal guargain due to developmental delay. .  She is aware of the possible side effects and risks and understands that    The medication will be discontinued if she has not lost 5% of her body weight over the next 3 months, which , based on today's weight is 14 lbs.

## 2013-11-06 ENCOUNTER — Encounter: Payer: Self-pay | Admitting: Internal Medicine

## 2013-12-09 ENCOUNTER — Encounter: Payer: Self-pay | Admitting: Internal Medicine

## 2013-12-13 ENCOUNTER — Encounter: Payer: Self-pay | Admitting: Internal Medicine

## 2013-12-30 ENCOUNTER — Other Ambulatory Visit: Payer: Self-pay | Admitting: Internal Medicine

## 2014-01-20 ENCOUNTER — Other Ambulatory Visit: Payer: Self-pay | Admitting: Internal Medicine

## 2014-01-27 ENCOUNTER — Ambulatory Visit (INDEPENDENT_AMBULATORY_CARE_PROVIDER_SITE_OTHER): Payer: BLUE CROSS/BLUE SHIELD | Admitting: Internal Medicine

## 2014-01-27 ENCOUNTER — Encounter: Payer: Self-pay | Admitting: Internal Medicine

## 2014-01-27 VITALS — BP 126/78 | HR 71 | Temp 98.0°F | Resp 16 | Ht 63.0 in | Wt 136.5 lb

## 2014-01-27 DIAGNOSIS — F411 Generalized anxiety disorder: Secondary | ICD-10-CM

## 2014-01-27 DIAGNOSIS — E663 Overweight: Secondary | ICD-10-CM

## 2014-01-27 MED ORDER — PHENTERMINE HCL 37.5 MG PO TABS: 37.5000 mg | ORAL_TABLET | Freq: Every day | ORAL | Status: DC

## 2014-01-27 MED ORDER — CITALOPRAM HYDROBROMIDE 20 MG PO TABS: 20.0000 mg | ORAL_TABLET | Freq: Every day | ORAL | Status: DC

## 2014-01-27 NOTE — Patient Instructions (Addendum)
We are going to continue the phentermine for 3 more months   During this time try to decrease her dose on days she is not going to be tempted by food  We are also switching the sertraline to citalopram to see if it helps with the sugar cravings  Please resume your exercise schedule 4 times per week and add one treadmill session at home   Please request the labs from [facility]

## 2014-01-27 NOTE — Progress Notes (Signed)
Patient ID: Ashlee Graves, female   DOB: 1985-10-28, 29 y.o.   MRN: [id number]   Patient Active Problem List   Diagnosis Date Noted  . Weight gain due to medication 08/13/2013  . Generalized anxiety disorder 06/23/2013  . Visit for preventive health examination 06/23/2013  . Excoriation, neurotic 06/14/2012  . Overweight (BMI 25.0-29.9) 06/14/2012  . DEPRESSION 12/29/2009  . DEVELOPMENTAL DELAY 12/29/2009  . ALLERGIC RHINITIS 12/29/2009  . DERMATITIS, SEBORRHEIC 12/29/2009    Subjective:  CC:   Chief Complaint  Patient presents with  . Follow-up    weight loss  . Hypertension    HPI:   Ashlee Graves is a 29 y.o. female who presents for  follow up on overweight due to overeating,  Patient has been taking phentermine for appetite suppression daily for the past 3 months with good tolerance. She has lost 23 lbs and is accompanied by her mother. Per her mother,  She does well at home because the food intake is monitored and the availability of starches and junk food is limited,  But has a more difficult time when away from home.  She has not been exercising as frequently over the holidays due to change in work schedule.   Wt Readings from Last 3 Encounters:  01/27/14 136 lb 8 oz (61.916 kg)  10/21/13 159 lb 12 oz (72.462 kg)  08/12/13 161 lb 12 oz (73.369 kg)    Past Medical History  Diagnosis Date  . Depression   . Allergy   . Developmental delay, moderate     No past surgical history on file.     The following portions of the patient's history were reviewed and updated as appropriate: Allergies, current medications, and problem list.    Review of Systems:   Patient denies headache, fevers, malaise, unintentional weight loss, skin rash, eye pain, sinus congestion and sinus pain, sore throat, dysphagia,  hemoptysis , cough, dyspnea, wheezing, chest pain, palpitations, orthopnea, edema, abdominal pain, nausea, melena, diarrhea, constipation, flank pain, dysuria,  hematuria, urinary  Frequency, nocturia, numbness, tingling, seizures,  Focal weakness, Loss of consciousness,  Tremor, insomnia, depression, anxiety, and suicidal ideation.     History   Social History  . Marital Status: Single    Spouse Name: N/A    Number of Children: N/A  . Years of Education: N/A   Occupational History  . Not on file.   Social History Main Topics  . Smoking status: Never Smoker   . Smokeless tobacco: Never Used  . Alcohol Use: Yes     Comment: occasionally  . Drug Use: No  . Sexual Activity: Not on file   Other Topics Concern  . Not on file   Social History Narrative    Objective:  Filed Vitals:   01/27/14 1641  BP: 126/78  Pulse: 71  Temp: 98 F (36.7 C)  Resp: 16     General appearance: alert, cooperative and appears stated age Ears: normal TM's and external ear canals both ears Throat: lips, mucosa, and tongue normal; teeth and gums normal Neck: no adenopathy, no carotid bruit, supple, symmetrical, trachea midline and thyroid not enlarged, symmetric, no tenderness/mass/nodules Back: symmetric, no curvature. ROM normal. No CVA tenderness. Lungs: clear to auscultation bilaterally Heart: regular rate and rhythm, S1, S2 normal, no murmur, click, rub or gallop Abdomen: soft, non-tender; bowel sounds normal; no masses,  no organomegaly Pulses: 2+ and symmetric Skin: Skin color, texture, turgor normal. No rashes or lesions Lymph nodes: Cervical, supraclavicular, and axillary nodes  normal.  Assessment and Plan:  Overweight (BMI 25.0-29.9) Secondary to compulsive overeating, complicated by developmental delay of unknown origin. She has done very well on the phentermine, BMI is now < 25.  Will continue for 3 more months, with plans to start weaning dose once she gets to goal weight of 120 lbs.  Resume regular exercise. chafing Zoloft to Celexa to see if it helps sugar cravings.   Generalized anxiety disorder Improved with zoloft,  Change to  celexa as a trial for mitigation of sugar cravings.   A total of 25 minutes of face to face time was spent with patient and mother (guardian) more than half of which was spent in counselling on the above mentioned issues.  Updated Medication List Outpatient Encounter Prescriptions as of 01/27/2014  Medication Sig  . azelastine (OPTIVAR) 0.05 % ophthalmic solution Place 1 drop into both eyes 2 (two) times daily.  . fexofenadine (ALLEGRA) 180 MG tablet TAKE 1 TABLET (180 MG TOTAL) BY MOUTH DAILY.  . fluticasone (FLONASE) 50 MCG/ACT nasal spray Place 2 sprays into the nose daily.  . phentermine (ADIPEX-P) 37.5 MG tablet Take 1 tablet (37.5 mg total) by mouth daily before breakfast.  . sertraline (ZOLOFT) 50 MG tablet TAKE 1 TABLET BY MOUTH ONCE A DAY  . TRI-PREVIFEM 0.18/0.215/0.25 MG-35 MCG tablet TAKE 1 TABLET BY MOUTH EVERY DAY  . [DISCONTINUED] phentermine (ADIPEX-P) 37.5 MG tablet Take 1 tablet (37.5 mg total) by mouth daily before breakfast.  . citalopram (CELEXA) 20 MG tablet Take 1 tablet (20 mg total) by mouth daily.     No orders of the defined types were placed in this encounter.    No Follow-up on file.

## 2014-01-27 NOTE — Progress Notes (Signed)
Pre-visit discussion using our clinic review tool. No additional management support is needed unless otherwise documented below in the visit note.  

## 2014-01-28 ENCOUNTER — Encounter: Payer: Self-pay | Admitting: *Deleted

## 2014-01-29 NOTE — Assessment & Plan Note (Signed)
Secondary to compulsive overeating, complicated by developmental delay of unknown origin. She has done very well on the phentermine, BMI is now < 25.  Will continue for 3 more months, with plans to start weaning dose once she gets to goal weight of 120 lbs.  Resume regular exercise. chafing Zoloft to Celexa to see if it helps sugar cravings.

## 2014-01-29 NOTE — Assessment & Plan Note (Signed)
Improved with zoloft,  Change to celexa as a trial for mitigation of sugar cravings.

## 2014-02-25 ENCOUNTER — Encounter: Payer: Self-pay | Admitting: Internal Medicine

## 2014-04-27 ENCOUNTER — Other Ambulatory Visit: Payer: Self-pay | Admitting: Internal Medicine

## 2014-04-28 ENCOUNTER — Ambulatory Visit (INDEPENDENT_AMBULATORY_CARE_PROVIDER_SITE_OTHER): Payer: BLUE CROSS/BLUE SHIELD | Admitting: Internal Medicine

## 2014-04-28 ENCOUNTER — Encounter: Payer: Self-pay | Admitting: Internal Medicine

## 2014-04-28 VITALS — BP 128/88 | HR 81 | Temp 98.0°F | Resp 16 | Ht 63.0 in | Wt 121.2 lb

## 2014-04-28 DIAGNOSIS — L309 Dermatitis, unspecified: Secondary | ICD-10-CM | POA: Diagnosis not present

## 2014-04-28 DIAGNOSIS — E663 Overweight: Secondary | ICD-10-CM | POA: Diagnosis not present

## 2014-04-28 MED ORDER — TRIAMCINOLONE ACETONIDE 0.1 % EX CREA: 1.0000 "application " | TOPICAL_CREAM | Freq: Two times a day (BID) | CUTANEOUS | Status: DC

## 2014-04-28 NOTE — Telephone Encounter (Signed)
Refill

## 2014-04-28 NOTE — Patient Instructions (Signed)
Triamcinolone ointment,  Use two times daily for your arm rash .  In the evening you may wrap the elbow in saran wrap to maximize the contact with skin  You can stop the medicine for weight loss.    Keep exercising and follow the diet !!   I'll see you in May /June

## 2014-04-28 NOTE — Progress Notes (Signed)
Patient ID: Ashlee Graves, female   DOB: Dec 17, 1985, 29 y.o.   MRN: [id number]   Patient Active Problem List   Diagnosis Date Noted  . Weight gain due to medication 08/13/2013  . Generalized anxiety disorder 06/23/2013  . Visit for preventive health examination 06/23/2013  . Excoriation, neurotic 06/14/2012  . Overweight (BMI 25.0-29.9) 06/14/2012  . DEPRESSION 12/29/2009  . DEVELOPMENTAL DELAY 12/29/2009  . ALLERGIC RHINITIS 12/29/2009  . Dermatitis due to unknown cause 12/29/2009    Subjective:  CC:   Chief Complaint  Patient presents with  . Follow-up    weight loss  . Rash    Right inner arm    HPI:   Ashlee Graves is a 29 y.o. female who presents for   Follow up on obesity managed with appetite suppressant, caloric restriction, and exercise.  She is accompanied by her mother,  Who has been monitoring her food intake and her medications since patient is learning disabled and lives with her parents.  She has thus far achieved a 40 lb wt loss since July 2015 using phentermine and has now reached her goal within 1 lb, which is 120 lbs.    Persistent pruritic rash on her right antecubital fossa, for the past week,  She denies any history of psoriasis and eczema.  No recent bug bites,  Has been scratching it relentlessly.  Taking an antihistamine.    Past Medical History  Diagnosis Date  . Depression   . Allergy   . Developmental delay, moderate     No past surgical history on file.     The following portions of the patient's history were reviewed and updated as appropriate: Allergies, current medications, and problem list.    Review of Systems:   Patient denies headache, fevers, malaise, unintentional weight loss, skin rash, eye pain, sinus congestion and sinus pain, sore throat, dysphagia,  hemoptysis , cough, dyspnea, wheezing, chest pain, palpitations, orthopnea, edema, abdominal pain, nausea, melena, diarrhea, constipation, flank pain, dysuria, hematuria,  urinary  Frequency, nocturia, numbness, tingling, seizures,  Focal weakness, Loss of consciousness,  Tremor, insomnia, depression, anxiety, and suicidal ideation.     History   Social History  . Marital Status: Single    Spouse Name: N/A  . Number of Children: N/A  . Years of Education: N/A   Occupational History  . Not on file.   Social History Main Topics  . Smoking status: Never Smoker   . Smokeless tobacco: Never Used  . Alcohol Use: Yes     Comment: occasionally  . Drug Use: No  . Sexual Activity: Not on file   Other Topics Concern  . Not on file   Social History Narrative    Objective:  Filed Vitals:   04/28/14 1711  BP: 128/88  Pulse: 81  Temp: 98 F (36.7 C)  Resp: 16     General appearance: alert, cooperative and appears stated age Neck: no adenopathy, no carotid bruit, supple, symmetrical, trachea midline and thyroid not enlarged, symmetric, no tenderness/mass/nodules Back: symmetric, no curvature. ROM normal. No CVA tenderness. Lungs: clear to auscultation bilaterally Heart: regular rate and rhythm, S1, S2 normal, no murmur, click, rub or gallop Abdomen: soft, non-tender; bowel sounds normal; no masses,  no organomegaly Pulses: 2+ and symmetric Skin: erythematous macular rash right antecubital fossa Lymph nodes: Cervical, supraclavicular, and axillary nodes normal.  Assessment and Plan:  Overweight (BMI 25.0-29.9) She is now within 1 lb of her state goal and BMI  is 21.  We  will discontinue the phentermine . Reviewed diet ,  Need for  Regular exercise 5 days per week to maintain healthy weight,  And she has annual exam in 2 months    Dermatitis due to unknown cause Trial of triamcinolone ointment bid to antecubital fossa.     Updated Medication List Outpatient Encounter Prescriptions as of 04/28/2014  Medication Sig  . citalopram (CELEXA) 20 MG tablet Take 1 tablet (20 mg total) by mouth daily.  . fexofenadine (ALLEGRA) 180 MG tablet TAKE 1  TABLET (180 MG TOTAL) BY MOUTH DAILY.  . fluticasone (FLONASE) 50 MCG/ACT nasal spray Place 2 sprays into the nose daily.  . TRI-PREVIFEM 0.18/0.215/0.25 MG-35 MCG tablet TAKE 1 TABLET BY MOUTH EVERY DAY  . [DISCONTINUED] azelastine (OPTIVAR) 0.05 % ophthalmic solution Place 1 drop into both eyes 2 (two) times daily.  . [DISCONTINUED] phentermine (ADIPEX-P) 37.5 MG tablet Take 1 tablet (37.5 mg total) by mouth daily before breakfast.  . sertraline (ZOLOFT) 50 MG tablet TAKE 1 TABLET BY MOUTH ONCE A DAY (Patient not taking: Reported on 04/28/2014)  . triamcinolone cream (KENALOG) 0.1 % Apply 1 application topically 2 (two) times daily.     No orders of the defined types were placed in this encounter.    No Follow-up on file.

## 2014-04-29 NOTE — Assessment & Plan Note (Signed)
She is now within 1 lb of her state goal and BMI  is 21.  We will discontinue the phentermine . Reviewed diet ,  Need for  Regular exercise 5 days per week to maintain healthy weight,  And she has annual exam in 2 months

## 2014-04-29 NOTE — Assessment & Plan Note (Signed)
Trial of triamcinolone ointment bid to antecubital fossa.

## 2014-05-20 ENCOUNTER — Other Ambulatory Visit: Payer: Self-pay | Admitting: Internal Medicine

## 2014-06-26 ENCOUNTER — Encounter: Payer: Self-pay | Admitting: Internal Medicine

## 2014-06-26 ENCOUNTER — Ambulatory Visit (INDEPENDENT_AMBULATORY_CARE_PROVIDER_SITE_OTHER): Payer: BLUE CROSS/BLUE SHIELD | Admitting: Internal Medicine

## 2014-06-26 VITALS — BP 114/74 | HR 55 | Temp 98.3°F | Resp 14 | Ht 62.5 in | Wt 130.5 lb

## 2014-06-26 DIAGNOSIS — F411 Generalized anxiety disorder: Secondary | ICD-10-CM

## 2014-06-26 DIAGNOSIS — Z0000 Encounter for general adult medical examination without abnormal findings: Secondary | ICD-10-CM | POA: Diagnosis not present

## 2014-06-26 DIAGNOSIS — E663 Overweight: Secondary | ICD-10-CM | POA: Diagnosis not present

## 2014-06-26 LAB — HEPATIC FUNCTION PANEL
ALT: 14 U/L (ref 7–35)
AST: 20 U/L (ref 13–35)
Alkaline Phosphatase: 41 U/L (ref 25–125)
Bilirubin, Total: 0.2 mg/dL

## 2014-06-26 LAB — LIPID PANEL
CHOLESTEROL: 142 mg/dL (ref 0–200)
HDL: 65 mg/dL (ref 35–70)
LDL CALC: 68 mg/dL
Triglycerides: 43 mg/dL (ref 40–160)

## 2014-06-26 LAB — CBC AND DIFFERENTIAL
HCT: 36 % (ref 36–46)
Hemoglobin: 12.4 g/dL (ref 12.0–16.0)
NEUTROS ABS: 3 /uL
Platelets: 239 10*3/uL (ref 150–399)
WBC: 5.8 10*3/mL

## 2014-06-26 LAB — BASIC METABOLIC PANEL
BUN: 10 mg/dL (ref 4–21)
Creatinine: 0.6 mg/dL (ref 0.5–1.1)
Glucose: 81 mg/dL
Potassium: 5.1 mmol/L (ref 3.4–5.3)
Sodium: 138 mmol/L (ref 137–147)

## 2014-06-26 LAB — TSH: TSH: 2.75 u[IU]/mL (ref 0.41–5.90)

## 2014-06-26 MED ORDER — NORGESTIM-ETH ESTRAD TRIPHASIC 0.18/0.215/0.25 MG-35 MCG PO TABS
1.0000 | ORAL_TABLET | Freq: Every day | ORAL | Status: DC
Start: 2014-06-26 — End: 2015-05-22

## 2014-06-26 MED ORDER — CITALOPRAM HYDROBROMIDE 20 MG PO TABS: ORAL_TABLET | ORAL | Status: DC

## 2014-06-26 MED ORDER — PHENTERMINE HCL 37.5 MG PO TABS: ORAL_TABLET | ORAL | Status: DC

## 2014-06-26 NOTE — Progress Notes (Signed)
Patient ID: Ashlee Graves, female    DOB: January 26, 1985  Age: 29 y.o. MRN: [id number]  The patient is here for annualwellness examination and management of other chronic and acute problems.  She has gained 9 lbs since stopping the daily phentermine  Disability insurance has been revoked and mother  is appealling it   The risk factors are reflected in the social history.  The roster of all physicians providing medical care to patient - is listed in the Snapshot section of the chart.  Activities of daily living:  The patient  Is disabled due to developmental delay and lives with her parents.  She is independent in all ADLs: dressing, toileting, feeding as well as independent mobility  Home safety : The patient has smoke detectors in the home. They wear seatbelts.  There are no firearms at home. There is no violence in the home.   There is no risks for hepatitis, STDs or HIV. There is no   history of blood transfusion. They have no travel history to infectious disease endemic areas of the world. She has never been sexually active.   The patient has seen their dentist in the last six month. They have seen their eye doctor in the last year. They admit to slight hearing difficulty with regard to whispered voices and some television programs.  They have deferred audiologic testing in the last year.  They do not  have excessive sun exposure. Discussed the need for sun protection: hats, long sleeves and use of sunscreen if there is significant sun exposure.   Diet: the importance of a healthy diet is discussed. They do have a healthy diet.  The benefits of regular aerobic exercise were discussed. She walks 4 times per week ,  20 minutes.   Depression screen: there are no signs or vegative symptoms of depression- irritability, change in appetite, anhedonia, sadness/tearfullness.  Cognitive assessment: the patient is unable to manage her financial or personal affairs due to developmental delay.  The  following portions of the patient's history were reviewed and updated as appropriate: allergies, current medications, past family history, past medical history,  past surgical history, past social history  and problem list.  Visual acuity was not assessed per patient preference since she has regular follow up with her ophthalmologist. Hearing and body mass index were assessed and reviewed.   During the course of the visit the patient was educated and counseled about appropriate screening and preventive services including : fall prevention , diabetes screening, nutrition counseling, colorectal cancer screening, and recommended immunizations.    CC: The primary encounter diagnosis was Visit for preventive health examination. Diagnoses of Overweight (BMI 25.0-29.9) and Generalized anxiety disorder were also pertinent to this visit.  History Ashlee Graves has a past medical history of Depression; Allergy; and Developmental delay, moderate.   She has no past surgical history on file.   Her family history includes Cancer in her maternal grandfather, maternal grandmother, and paternal grandfather; Hyperlipidemia in her father; Hypertension in her father.She reports that she has never smoked. She has never used smokeless tobacco. She reports that she drinks alcohol. She reports that she does not use illicit drugs.  Outpatient Prescriptions Prior to Visit  Medication Sig Dispense Refill  . azelastine (OPTIVAR) 0.05 % ophthalmic solution PLACE 1 DROP INTO BOTH EYES 2 (TWO) TIMES DAILY. 6 mL 12  . fexofenadine (ALLEGRA) 180 MG tablet TAKE 1 TABLET (180 MG TOTAL) BY MOUTH DAILY. 30 tablet 11  . fluticasone (FLONASE) 50 MCG/ACT nasal spray  Place 2 sprays into the nose daily. 16 g 11  . triamcinolone cream (KENALOG) 0.1 % Apply 1 application topically 2 (two) times daily. 30 g 0  . citalopram (CELEXA) 20 MG tablet TAKE 1 TABLET (20 MG TOTAL) BY MOUTH DAILY. 30 tablet 2  . TRI-PREVIFEM 0.18/0.215/0.25 MG-35 MCG  tablet TAKE 1 TABLET BY MOUTH EVERY DAY 28 tablet 5  . sertraline (ZOLOFT) 50 MG tablet TAKE 1 TABLET BY MOUTH ONCE A DAY (Patient not taking: Reported on 04/28/2014) 30 tablet 2   No facility-administered medications prior to visit.    Review of Systems   Patient denies headache, fevers, malaise, unintentional weight loss, skin rash, eye pain, sinus congestion and sinus pain, sore throat, dysphagia,  hemoptysis , cough, dyspnea, wheezing, chest pain, palpitations, orthopnea, edema, abdominal pain, nausea, melena, diarrhea, constipation, flank pain, dysuria, hematuria, urinary  Frequency, nocturia, numbness, tingling, seizures,  Focal weakness, Loss of consciousness,  Tremor, insomnia, depression, anxiety, and suicidal ideation.      Objective:  BP 114/74 mmHg  Pulse 55  Temp(Src) 98.3 F (36.8 C) (Oral)  Resp 14  Ht 5' 2.5" (1.588 m)  Wt 130 lb 8 oz (59.194 kg)  BMI 23.47 kg/m2  SpO2 99%  LMP 06/10/2014 (Approximate)  Physical Exam  General Appearance:    Alert, cooperative, no distress, appears stated age  Head:    Normocephalic, without obvious abnormality, atraumatic  Eyes:    PERRL, conjunctiva/corneas clear, EOM's intact, fundi    benign, both eyes  Ears:    Normal TM's and external ear canals, both ears  Nose:   Nares normal, septum midline, mucosa normal, no drainage    or sinus tenderness  Throat:   Lips, mucosa, and tongue normal; teeth and gums normal  Neck:   Supple, symmetrical, trachea midline, no adenopathy;    thyroid:  no enlargement/tenderness/nodules; no carotid   bruit or JVD  Back:     Symmetric, no curvature, ROM normal, no CVA tenderness  Lungs:     Clear to auscultation bilaterally, respirations unlabored  Chest Wall:    No tenderness or deformity   Heart:    Regular rate and rhythm, S1 and S2 normal, no murmur, rub   or gallop  Breast Exam:    No tenderness, masses, or nipple abnormality  Abdomen:     Soft, non-tender, bowel sounds active all four  quadrants,    no masses, no organomegaly  Genitalia:    Speculum exam was attempted but stopped early due to patient discomfort,  She has a narrow vaginal vault.  vagina normal without discharge  Extremities:   Extremities normal, atraumatic, no cyanosis or edema  Pulses:   2+ and symmetric all extremities  Skin:   Skin color, texture, turgor normal, no rashes or lesions  Lymph nodes:   Cervical, supraclavicular, and axillary nodes normal  Neurologic:   CNII-XII intact, normal strength, sensation and reflexes    throughout     Assessment & Plan:   Problem List Items Addressed This Visit    Overweight (BMI 25.0-29.9)    She has gained weight since stopping the phentermine . Due to her developmental delay , she lacks the ability to control her eating.  Her mother and I discussed resuming the phentermine 2 or times per week , one time daily       Generalized anxiety disorder    Managed with celexa       Visit for preventive health examination - Primary  Annual wellness  exam was done as well as a comprehensive physical exam  .  During the course of the visit the patient was educated and counseled about appropriate screening and preventive services and screenings were brought up to date for cervical and breast cancer .  She will return for fasting labs to provide samples for diabetes screening and lipid analysis with projected  10 year  risk for CAD. nutrition counseling, skin cancer screening has been recommended, along with review of the age appropriate recommended immunizations.  Printed recommendations for health maintenance screenings was given.           I have discontinued Ms. Demeter's sertraline. I have also changed her TRI-PREVIFEM to Norgestimate-Ethinyl Estradiol Triphasic. Additionally, I am having her start on phentermine. Lastly, I am having her maintain her fluticasone, fexofenadine, azelastine, triamcinolone cream, and citalopram.  Meds ordered this encounter   Medications  . citalopram (CELEXA) 20 MG tablet    Sig: TAKE 1 TABLET (20 MG TOTAL) BY MOUTH DAILY.    Dispense:  90 tablet    Refill:  2  . Norgestimate-Ethinyl Estradiol Triphasic (TRI-PREVIFEM) 0.18/0.215/0.25 MG-35 MCG tablet    Sig: Take 1 tablet by mouth daily.    Dispense:  3 Package    Refill:  3  . phentermine (ADIPEX-P) 37.5 MG tablet    Sig: 1/2 tablet in the am and early afternoon    Dispense:  30 tablet    Refill:  2    Medications Discontinued During This Encounter  Medication Reason  . sertraline (ZOLOFT) 50 MG tablet Change in therapy  . citalopram (CELEXA) 20 MG tablet Reorder  . TRI-PREVIFEM 0.18/0.215/0.25 MG-35 MCG tablet Reorder    Follow-up: No Follow-up on file.   Crecencio Mc, MD

## 2014-06-26 NOTE — Patient Instructions (Signed)
I am refilling the phentermine for use as needed to keep Ashlee Graves's weight down  Use it as little as possible

## 2014-06-26 NOTE — Progress Notes (Signed)
Pre-visit discussion using our clinic review tool. No additional management support is needed unless otherwise documented below in the visit note.  

## 2014-06-28 NOTE — Assessment & Plan Note (Signed)
She has gained weight since stopping the phentermine . Due to her developmental delay , she lacks the ability to control her eating.  Her mother and I discussed resuming the phentermine 2 or times per week , one time daily

## 2014-06-28 NOTE — Assessment & Plan Note (Signed)

## 2014-06-28 NOTE — Assessment & Plan Note (Signed)
Managed with celexa.   

## 2014-07-05 ENCOUNTER — Other Ambulatory Visit: Payer: Self-pay | Admitting: Internal Medicine

## 2014-09-24 ENCOUNTER — Other Ambulatory Visit: Payer: Self-pay | Admitting: Internal Medicine

## 2014-09-27 ENCOUNTER — Other Ambulatory Visit: Payer: Self-pay | Admitting: Internal Medicine

## 2014-12-26 ENCOUNTER — Ambulatory Visit: Payer: BLUE CROSS/BLUE SHIELD | Admitting: Internal Medicine

## 2015-01-02 ENCOUNTER — Ambulatory Visit (INDEPENDENT_AMBULATORY_CARE_PROVIDER_SITE_OTHER): Payer: BLUE CROSS/BLUE SHIELD | Admitting: Internal Medicine

## 2015-01-02 VITALS — BP 114/78 | HR 61 | Temp 98.0°F | Resp 12 | Ht 63.0 in | Wt 136.2 lb

## 2015-01-02 DIAGNOSIS — L981 Factitial dermatitis: Secondary | ICD-10-CM

## 2015-01-02 DIAGNOSIS — E663 Overweight: Secondary | ICD-10-CM

## 2015-01-02 MED ORDER — HYDROXYZINE HCL 25 MG PO TABS: 25.0000 mg | ORAL_TABLET | Freq: Three times a day (TID) | ORAL | Status: DC | PRN

## 2015-01-02 MED ORDER — PHENTERMINE HCL 37.5 MG PO TABS: ORAL_TABLET | ORAL | Status: DC

## 2015-01-02 NOTE — Progress Notes (Signed)
Subjective:  Patient ID: Ashlee Graves, female    DOB: 13-Dec-1985  Age: 29 y.o. MRN: [id number]  CC: The primary encounter diagnosis was Overweight (BMI 25.0-29.9). A diagnosis of Excoriation, neurotic was also pertinent to this visit.  HPI Ashlee Graves presents for follow up on eating disorder complicated by GAD and developmental delay .  Since stopping the phentermine ,she hs been overeating and gaining weight.Her mother is concerned that without the medication she will continue to regain all of the weight she lost over the past year with the use of the appeitite suppressant and regular exercise.    Outpatient Prescriptions Prior to Visit  Medication Sig Dispense Refill  . azelastine (OPTIVAR) 0.05 % ophthalmic solution PLACE 1 DROP INTO BOTH EYES 2 (TWO) TIMES DAILY. 6 mL 12  . citalopram (CELEXA) 20 MG tablet TAKE 1 TABLET (20 MG TOTAL) BY MOUTH DAILY. 90 tablet 2  . fexofenadine (ALLEGRA) 180 MG tablet TAKE 1 TABLET BY MOUTH DAILY 30 tablet 11  . fluticasone (FLONASE) 50 MCG/ACT nasal spray Place 2 sprays into the nose daily. 16 g 11  . Norgestimate-Ethinyl Estradiol Triphasic (TRI-PREVIFEM) 0.18/0.215/0.25 MG-35 MCG tablet Take 1 tablet by mouth daily. 3 Package 3  . triamcinolone cream (KENALOG) 0.1 % Apply 1 application topically 2 (two) times daily. 30 g 0  . phentermine (ADIPEX-P) 37.5 MG tablet 1/2 tablet in the am and early afternoon 30 tablet 2  . cetirizine (ZYRTEC) 10 MG tablet TAKE 1 TABLET BY MOUTH EVERY DAY 90 tablet 3   No facility-administered medications prior to visit.    Review of Systems;  Patient denies headache, fevers, malaise, unintentional weight loss, skin rash, eye pain, sinus congestion and sinus pain, sore throat, dysphagia,  hemoptysis , cough, dyspnea, wheezing, chest pain, palpitations, orthopnea, edema, abdominal pain, nausea, melena, diarrhea, constipation, flank pain, dysuria, hematuria, urinary  Frequency, nocturia, numbness, tingling, seizures,   Focal weakness, Loss of consciousness,  Tremor, insomnia, depression, anxiety, and suicidal ideation.      Objective:  BP 114/78 mmHg  Pulse 61  Temp(Src) 98 F (36.7 C) (Oral)  Resp 12  Ht 5\' 3"  (1.6 m)  Wt 136 lb 4 oz (61.803 kg)  BMI 24.14 kg/m2  SpO2 99%  BP Readings from Last 3 Encounters:  01/02/15 114/78  06/26/14 114/74  04/28/14 128/88    Wt Readings from Last 3 Encounters:  01/02/15 136 lb 4 oz (61.803 kg)  06/26/14 130 lb 8 oz (59.194 kg)  04/28/14 121 lb 4 oz (54.999 kg)    General appearance: alert, cooperative and appears stated age Ears: normal TM's and external ear canals both ears Throat: lips, mucosa, and tongue normal; teeth and gums normal Neck: no adenopathy, no carotid bruit, supple, symmetrical, trachea midline and thyroid not enlarged, symmetric, no tenderness/mass/nodules Back: symmetric, no curvature. ROM normal. No CVA tenderness. Lungs: clear to auscultation bilaterally Heart: regular rate and rhythm, S1, S2 normal, no murmur, click, rub or gallop Abdomen: soft, non-tender; bowel sounds normal; no masses,  no organomegaly Pulses: 2+ and symmetric Skin: Skin color, texture, turgor normal. No rashes or lesions Lymph nodes: Cervical, supraclavicular, and axillary nodes normal.  No results found for: HGBA1C  Lab Results  Component Value Date   CREATININE 0.6 06/26/2014   CREATININE 0.6 06/28/2013   CREATININE 0.6 07/06/2012    Lab Results  Component Value Date   WBC 5.8 06/26/2014   HGB 12.4 06/26/2014   HCT 36 06/26/2014   PLT 239 06/26/2014   CHOL  142 06/26/2014   TRIG 43 06/26/2014   HDL 65 06/26/2014   LDLCALC 68 06/26/2014   ALT 14 06/26/2014   AST 20 06/26/2014   NA 138 06/26/2014   K 5.1 06/26/2014   CREATININE 0.6 06/26/2014   BUN 10 06/26/2014   TSH 2.75 06/26/2014    No results found.  Assessment & Plan:   Problem List Items Addressed This Visit    Excoriation, neurotic    Recurrent,  rx hydroxyzine       Overweight (BMI 25.0-29.9) - Primary    Secondary to eating disorder/overating complicated by developmental delay.  We discussed alternatives to continuing a stimulant  Long term.  Advised her to try giving [place] or othe rbulk forming laxative 30 minutes prior to dinner to [roduce some satiety and discourage overeating.         A total of 25 minutes of face to face time was spent with patient more than half of which was spent in counselling about the above mentioned conditions  and coordination of care   I have discontinued Ms. Crager's cetirizine. I am also having her start on hydrOXYzine. Additionally, I am having her maintain her fluticasone, azelastine, triamcinolone cream, citalopram, Norgestimate-Ethinyl Estradiol Triphasic, fexofenadine, and phentermine.  Meds ordered this encounter  Medications  . phentermine (ADIPEX-P) 37.5 MG tablet    Sig: 1/2 tablet in the am and early afternoon    Dispense:  30 tablet    Refill:  2  . hydrOXYzine (ATARAX/VISTARIL) 25 MG tablet    Sig: Take 1 tablet (25 mg total) by mouth 3 (three) times daily as needed for itching.    Dispense:  30 tablet    Refill:  0    Medications Discontinued During This Encounter  Medication Reason  . cetirizine (ZYRTEC) 10 MG tablet Change in therapy  . phentermine (ADIPEX-P) 37.5 MG tablet Reorder    Follow-up: No Follow-up on file.   Crecencio Mc, MD

## 2015-01-02 NOTE — Patient Instructions (Addendum)
You can take docusate every day or as needed as a stool softener   I advise filling Ashlee Graves up before diner with metamucil,  Or miralax, or citrucel ,  fibercon   30  Minutes prior to meals plus a big garden salad   Try snacking on the Mini Sweet Peppers (red yellow and orange ) with or without a dip   Get the Kind Low glycemic index Bars ([employer],  [place] )  You can try the hydroxyzine if needed for neurotic itching .  Start with 1/2 tablet..  It may make her drowsy

## 2015-01-04 ENCOUNTER — Encounter: Payer: Self-pay | Admitting: Internal Medicine

## 2015-01-04 NOTE — Assessment & Plan Note (Signed)
Recurrent,  rx hydroxyzine

## 2015-01-04 NOTE — Assessment & Plan Note (Signed)
Secondary to eating disorder/overating complicated by developmental delay.  We discussed alternatives to continuing a stimulant  Long term.  Advised her to try giving Ashlee Graves or othe rbulk forming laxative 30 minutes prior to dinner to [roduce some satiety and discourage overeating.

## 2015-03-23 ENCOUNTER — Other Ambulatory Visit: Payer: Self-pay | Admitting: Internal Medicine

## 2015-04-09 ENCOUNTER — Other Ambulatory Visit: Payer: Self-pay | Admitting: Internal Medicine

## 2015-04-16 ENCOUNTER — Ambulatory Visit (INDEPENDENT_AMBULATORY_CARE_PROVIDER_SITE_OTHER): Payer: BLUE CROSS/BLUE SHIELD | Admitting: Internal Medicine

## 2015-04-16 ENCOUNTER — Encounter: Payer: Self-pay | Admitting: Internal Medicine

## 2015-04-16 VITALS — BP 120/78 | HR 62 | Temp 98.1°F | Resp 12 | Ht 63.0 in | Wt 143.8 lb

## 2015-04-16 DIAGNOSIS — E663 Overweight: Secondary | ICD-10-CM

## 2015-04-16 DIAGNOSIS — Z23 Encounter for immunization: Secondary | ICD-10-CM | POA: Diagnosis not present

## 2015-04-16 DIAGNOSIS — F411 Generalized anxiety disorder: Secondary | ICD-10-CM

## 2015-04-16 MED ORDER — HYDROXYZINE HCL 25 MG PO TABS: 25.0000 mg | ORAL_TABLET | Freq: Three times a day (TID) | ORAL | Status: DC | PRN

## 2015-04-16 MED ORDER — FLUTICASONE PROPIONATE 50 MCG/ACT NA SUSP: 2.0000 | Freq: Every day | NASAL | Status: DC

## 2015-04-16 NOTE — Progress Notes (Signed)
Pre-visit discussion using our clinic review tool. No additional management support is needed unless otherwise documented below in the visit note.  

## 2015-04-16 NOTE — Patient Instructions (Addendum)
You received the Tetanus Booster vaccine today   I'm glad you are exercising !   Here are some low carb alternatives :  Instead of instant oatmeal (all sugar!)  ,  Try the Premier Protein shakes   To make a low carb chip :  Take the [employer] Lavash or Pita bread,  Or the [place] Low carb whole wheat tortilla   Place on metal cookie sheet  Brush with olive oil  Sprinkle garlic powder (NOT garlic salt), grated parmesan cheese, mediterranean seasoning , or all of them?  Bake at 225 or 250 for 90 minutes .  Tastes like Stacey's pita chips!   We have substitutions for your potatoes!!  Try the mashed cauliflower and riced cauliflower dishes instead of rice and mashed potatoes  Mashed turnips are also very low carb!   For low carb dessert:  Try the [employer] greek yogurt dessert flavors (key lime pie, strawberry cheesecake,  Blueberry) and top with Reddi Whip .  8 carbs,  80 calories  Try Oikos Triple Zero [place] Yogurt in the salted caramel, and the coffee flavors  With Whipped Cream for dessert     You cna use triamcinolone cream sparingly at the nape of her neck as needed for itching  Resume th hdryoxzine for all over itching

## 2015-04-16 NOTE — Progress Notes (Signed)
Subjective:  Patient ID: Ashlee Graves, female    DOB: 05-25-1985  Age: 30 y.o. MRN: [id number]  CC: The primary encounter diagnosis was Need for prophylactic vaccination with tetanus toxoid alone. Diagnoses of Overweight (BMI 25.0-29.9) and Generalized anxiety disorder were also pertinent to this visit.  HPI  Ashlee Graves presents for follow up on obesity and GAD with neurotic itching.  She is developmentally delayed and is accompanied by her mother.    Schedule reviewed:  In bed 10 to 6  mon and wed ,  Sleeps in other days. Started a progrem of regular daily exercise three days ago when mother realized she had been gaining weight again. Mother has limited administration of  phentermine  To only 4 times in the last month, to control her appetite in social situations where she tends to over eat.    30 minutes on the bike 4 times per week.  Adding a 2.6 mile walk soon .   Still has epidoses of generalized pruritis that appear to be due to anxiety.  No rash seen.  Some flaking of scalp ,  Uses Head and Shoulders.  Nape of neck problematic.   Outpatient Prescriptions Prior to Visit  Medication Sig Dispense Refill  . azelastine (OPTIVAR) 0.05 % ophthalmic solution PLACE 1 DROP INTO BOTH EYES 2 (TWO) TIMES DAILY. 6 mL 12  . citalopram (CELEXA) 20 MG tablet TAKE 1 TABLET (20 MG TOTAL) BY MOUTH ONCE A DAY 90 tablet 1  . fexofenadine (ALLEGRA) 180 MG tablet TAKE 1 TABLET BY MOUTH DAILY 30 tablet 11  . Norgestimate-Ethinyl Estradiol Triphasic (TRI-PREVIFEM) 0.18/0.215/0.25 MG-35 MCG tablet Take 1 tablet by mouth daily. 3 Package 3  . phentermine (ADIPEX-P) 37.5 MG tablet 1/2 tablet in the am and early afternoon 30 tablet 2  . triamcinolone cream (KENALOG) 0.1 % Apply 1 application topically 2 (two) times daily. 30 g 0  . fluticasone (FLONASE) 50 MCG/ACT nasal spray Place 2 sprays into the nose daily. 16 g 11  . hydrOXYzine (ATARAX/VISTARIL) 25 MG tablet Take 1 tablet (25 mg total) by mouth 3  (three) times daily as needed for itching. 30 tablet 0   No facility-administered medications prior to visit.    Review of Systems;  Patient denies headache, fevers, malaise, unintentional weight loss, skin rash, eye pain, sinus congestion and sinus pain, sore throat, dysphagia,  hemoptysis , cough, dyspnea, wheezing, chest pain, palpitations, orthopnea, edema, abdominal pain, nausea, melena, diarrhea, constipation, flank pain, dysuria, hematuria, urinary  Frequency, nocturia, numbness, tingling, seizures,  Focal weakness, Loss of consciousness,  Tremor, insomnia, depression, anxiety, and suicidal ideation.      Objective:  BP 120/78 mmHg  Pulse 62  Temp(Src) 98.1 F (36.7 C) (Oral)  Resp 12  Ht 5\' 3"  (1.6 m)  Wt 143 lb 12 oz (65.205 kg)  BMI 25.47 kg/m2  SpO2 99%  LMP 04/16/2015  BP Readings from Last 3 Encounters:  04/16/15 120/78  01/02/15 114/78  06/26/14 114/74    Wt Readings from Last 3 Encounters:  04/16/15 143 lb 12 oz (65.205 kg)  01/02/15 136 lb 4 oz (61.803 kg)  06/26/14 130 lb 8 oz (59.194 kg)    General appearance: alert, cooperative and appears stated age Ears: normal TM's and external ear canals both ears Throat: lips, mucosa, and tongue normal; teeth and gums normal Neck: no adenopathy, no carotid bruit, supple, symmetrical, trachea midline and thyroid not enlarged, symmetric, no tenderness/mass/nodules Back: symmetric, no curvature. ROM normal. No CVA tenderness. Lungs:  clear to auscultation bilaterally Heart: regular rate and rhythm, S1, S2 normal, no murmur, click, rub or gallop Abdomen: soft, non-tender; bowel sounds normal; no masses,  no organomegaly Pulses: 2+ and symmetric Skin: Skin color, texture, turgor normal. No rashes or lesions Lymph nodes: Cervical, supraclavicular, and axillary nodes normal.  No results found for: HGBA1C  Lab Results  Component Value Date   CREATININE 0.6 06/26/2014   CREATININE 0.6 06/28/2013   CREATININE 0.6  07/06/2012    Lab Results  Component Value Date   WBC 5.8 06/26/2014   HGB 12.4 06/26/2014   HCT 36 06/26/2014   PLT 239 06/26/2014   CHOL 142 06/26/2014   TRIG 43 06/26/2014   HDL 65 06/26/2014   LDLCALC 68 06/26/2014   ALT 14 06/26/2014   AST 20 06/26/2014   NA 138 06/26/2014   K 5.1 06/26/2014   CREATININE 0.6 06/26/2014   BUN 10 06/26/2014   TSH 2.75 06/26/2014    No results found.  Assessment & Plan:   Problem List Items Addressed This Visit    Overweight (BMI 25.0-29.9)    Secondary to eating disorder/overating complicated by developmental delay.  Mother is using phentermine sparingly and encouraging regular exercise.  Encouraged her to give her a BFL 30   minutes prior to dinner to [roduce some satiety and discourage overeating.          Generalized anxiety disorder    Managed with celexa   No changes today,  continue vistaril for itching          Other Visit Diagnoses    Need for prophylactic vaccination with tetanus toxoid alone    -  Primary    Relevant Orders    Td : Tetanus/diphtheria >7yo Preservative  free (Completed)       I have changed Ms. Champoux's fluticasone. I am also having her maintain her triamcinolone cream, Norgestimate-Ethinyl Estradiol Triphasic, fexofenadine, phentermine, azelastine, citalopram, and hydrOXYzine.  Meds ordered this encounter  Medications  . DISCONTD: hydrOXYzine (ATARAX/VISTARIL) 25 MG tablet    Sig: Take 1 tablet (25 mg total) by mouth 3 (three) times daily as needed for itching.    Dispense:  30 tablet    Refill:  5  . fluticasone (FLONASE) 50 MCG/ACT nasal spray    Sig: Place 2 sprays into both nostrils daily.    Dispense:  16 g    Refill:  11  . hydrOXYzine (ATARAX/VISTARIL) 25 MG tablet    Sig: Take 1 tablet (25 mg total) by mouth 3 (three) times daily as needed for itching.    Dispense:  30 tablet    Refill:  5  A total of 25 minutes of face to face time was spent with patient more than half of which was  spent in counselling about the above mentioned conditions  and coordination of care   Medications Discontinued During This Encounter  Medication Reason  . hydrOXYzine (ATARAX/VISTARIL) 25 MG tablet Reorder  . fluticasone (FLONASE) 50 MCG/ACT nasal spray Reorder  . hydrOXYzine (ATARAX/VISTARIL) 25 MG tablet Reorder    Follow-up: Return in about 3 months (around 07/17/2015), or CPE.   Crecencio Mc, MD

## 2015-04-18 NOTE — Assessment & Plan Note (Signed)
Managed with celexa   No changes today,  continue vistaril for itching

## 2015-04-18 NOTE — Assessment & Plan Note (Signed)
Secondary to eating disorder/overating complicated by developmental delay.  Mother is using phentermine sparingly and encouraging regular exercise.  Encouraged her to give her a BFL 30   minutes prior to dinner to [roduce some satiety and discourage overeating.

## 2015-05-22 ENCOUNTER — Other Ambulatory Visit: Payer: Self-pay | Admitting: Internal Medicine

## 2015-08-14 ENCOUNTER — Ambulatory Visit (INDEPENDENT_AMBULATORY_CARE_PROVIDER_SITE_OTHER): Payer: BLUE CROSS/BLUE SHIELD | Admitting: Internal Medicine

## 2015-08-14 ENCOUNTER — Encounter: Payer: Self-pay | Admitting: Internal Medicine

## 2015-08-14 VITALS — BP 112/84 | HR 64 | Temp 98.7°F | Resp 16 | Ht 63.0 in | Wt 153.0 lb

## 2015-08-14 DIAGNOSIS — F09 Unspecified mental disorder due to known physiological condition: Secondary | ICD-10-CM | POA: Diagnosis not present

## 2015-08-14 DIAGNOSIS — F509 Eating disorder, unspecified: Secondary | ICD-10-CM | POA: Diagnosis not present

## 2015-08-14 DIAGNOSIS — Z0000 Encounter for general adult medical examination without abnormal findings: Secondary | ICD-10-CM

## 2015-08-14 DIAGNOSIS — E663 Overweight: Secondary | ICD-10-CM

## 2015-08-14 MED ORDER — NALTREXONE-BUPROPION HCL ER 8-90 MG PO TB12: ORAL_TABLET | ORAL | Status: DC

## 2015-08-14 NOTE — Patient Instructions (Addendum)
Increase the citalopram to full tablet daily (20 mg)   We discussed a Trial of Contrave for appetite suppression.  The dose is gradually increased over two weeks.  Call if Minami develops any signs of nervousness or agitation.  The other long term choice is saxenda,  A daily injection    You can also Resume the  Metamucil prior to meals to fill  Up    Plesae ask the [place] to check the folowing labs:  CMET TSH Lipid panel  CBC w diff

## 2015-08-14 NOTE — Progress Notes (Signed)
Patient ID: Ashlee Graves, female    DOB: July 16, 1985  Age: 30 y.o. MRN: [id number]  The patient is here for annual CPE and management of other chronic and acute problems.   The risk factors are reflected in the social history. She livers with her parents and is not sexually active.   The roster of all physicians providing medical care to patient - is listed in the Snapshot section of the chart.  Activities of daily living:  The patient is 100% independent in all ADLs: dressing, toileting, feeding as well as independent mobility, but due to developmental delay as a child she is unable to live independently  Home safety : The patient has smoke detectors in the home. They wear seatbelts.  There are no firearms at home. There is no violence in the home.   There is no risks for hepatitis, STDs or HIV. There is no   history of blood transfusion. They have no travel history to infectious disease endemic areas of the world.  The patient has seen their dentist in the last six month. They have seen their eye doctor in the last year. They admit to slight hearing difficulty with regard to whispered voices and some television programs.  They have deferred audiologic testing in the last year.  They do not  have excessive sun exposure. Discussed the need for sun protection: hats, long sleeves and use of sunscreen if there is significant sun exposure.   Diet: the importance of a healthy diet is discussed. They do have a healthy diet.  The benefits of regular aerobic exercise were discussed. She walks 4 times per week ,  20 minutes.   Depression screen: there are no signs or vegative symptoms of depression- irritability, change in appetite, anhedonia, sadness/tearfullness.  Cognitive assessment: the patient does not manage her  financial and personal affairs due to a history of developmental delay.  She works part time at a [employer]  and is actively engaged in an adult daycare program. She cannot relate  day,date,year or  recent  Events.   The following portions of the patient's history were reviewed and updated as appropriate: allergies, current medications, past family history, past medical history,  past surgical history, past social history  and problem list.  Visual acuity was not assessed per patient preference since she has regular follow up with her ophthalmologist. Hearing and body mass index were assessed and reviewed.   During the course of the visit the patient was educated and counseled about appropriate screening and preventive services including : fall prevention , diabetes screening, nutrition counseling, colorectal cancer screening, and recommended immunizations.    CC: The primary encounter diagnosis was Acquired cognitive dysfunction. Diagnoses of Eating disorder, Overweight (BMI 25.0-29.9), and Visit for preventive health examination were also pertinent to this visit.   FOLLOW UP ON OVERWEIGHT managed with phentermine initially.  Patient has  gained 10 lbs   Discussed long term appetite suppressant use with patient's mother,  Who is concerned that patient has an eating disorder since she will eat until she feels nauseated .  Discussed alternatives to phentermine including oral contrave and  injectible  Saxenda.   History Ashlee Graves has a past medical history of Allergy; Depression; and Developmental delay, moderate.   She has no past surgical history on file.   Her family history includes Cancer in her maternal grandfather, maternal grandmother, and paternal grandfather; Hyperlipidemia in her father; Hypertension in her father.She reports that she has never smoked. She has  never used smokeless tobacco. She reports that she drinks alcohol. She reports that she does not use drugs.  Outpatient Medications Prior to Visit  Medication Sig Dispense Refill  . azelastine (OPTIVAR) 0.05 % ophthalmic solution PLACE 1 DROP INTO BOTH EYES 2 (TWO) TIMES DAILY. 6 mL 12  . citalopram (CELEXA)  20 MG tablet TAKE 1 TABLET (20 MG TOTAL) BY MOUTH ONCE A DAY 90 tablet 1  . fexofenadine (ALLEGRA) 180 MG tablet TAKE 1 TABLET BY MOUTH DAILY 30 tablet 11  . fluticasone (FLONASE) 50 MCG/ACT nasal spray Place 2 sprays into both nostrils daily. 16 g 11  . hydrOXYzine (ATARAX/VISTARIL) 25 MG tablet Take 1 tablet (25 mg total) by mouth 3 (three) times daily as needed for itching. 30 tablet 5  . TRI-PREVIFEM 0.18/0.215/0.25 MG-35 MCG tablet TAKE 1 TABLET BY MOUTH ONCE A DAY 84 tablet 3  . triamcinolone cream (KENALOG) 0.1 % Apply 1 application topically 2 (two) times daily. 30 g 0  . phentermine (ADIPEX-P) 37.5 MG tablet 1/2 tablet in the am and early afternoon (Patient not taking: Reported on 08/14/2015) 30 tablet 2   No facility-administered medications prior to visit.     Review of Systems   Patient denies headache, fevers, malaise, unintentional weight loss, skin rash, eye pain, sinus congestion and sinus pain, sore throat, dysphagia,  hemoptysis , cough, dyspnea, wheezing, chest pain, palpitations, orthopnea, edema, abdominal pain, nausea, melena, diarrhea, constipation, flank pain, dysuria, hematuria, urinary  Frequency, nocturia, numbness, tingling, seizures,  Focal weakness, Loss of consciousness,  Tremor, insomnia, depression, anxiety, and suicidal ideation.      Objective:  BP 112/84   Pulse 64   Temp 98.7 F (37.1 C) (Oral)   Resp 16   Ht 5\' 3"  (1.6 m)   Wt 153 lb (69.4 kg)   LMP 07/27/2015   BMI 27.10 kg/m   Physical Exam   General appearance: alert, cooperative and appears stated age Head: Normocephalic, without obvious abnormality, atraumatic Eyes: conjunctivae/corneas clear. PERRL, EOM's intact. Fundi benign. Ears: normal TM's and external ear canals both ears Nose: Nares normal. Septum midline. Mucosa normal. No drainage or sinus tenderness. Throat: lips, mucosa, and tongue normal; teeth and gums normal Neck: no adenopathy, no carotid bruit, no JVD, supple,  symmetrical, trachea midline and thyroid not enlarged, symmetric, no tenderness/mass/nodules Lungs: clear to auscultation bilaterally Breasts: normal appearance, no masses or tenderness Heart: regular rate and rhythm, S1, S2 normal, no murmur, click, rub or gallop Abdomen: soft, non-tender; bowel sounds normal; no masses,  no organomegaly Extremities: extremities normal, atraumatic, no cyanosis or edema Pulses: 2+ and symmetric Skin: Skin color, texture, turgor normal. No rashes or lesions Neurologic: Alert and oriented X 3, normal strength and tone. Normal symmetric reflexes. Normal coordination and gait.     Assessment & Plan:   Problem List Items Addressed This Visit    Acquired cognitive dysfunction - Primary    Etiology is unclear but per mother occurred  During birth resulting in developmental delay.  she is unable to live independently.  She has developed an over eating disorder which has thus far been managed with phentermine but long term management will require transition to other safer medications.  Trial of contrave  Disucssed,  But will refer patient to Neuropsychiatry  for further evaluation       Relevant Orders   Ambulatory referral to Neuropsychology   Overweight (BMI 25.0-29.9)    Secondary to eating disorder complicated by cognitive impairment/history of developmental delay. Screening for  thyroid, diabetes has been done in the past  Lab Results  Component Value Date   TSH 2.75 06/26/2014   No results found for: HGBA1C       Visit for preventive health examination    Annual comprehensive preventive exam was done as well as an evaluation and management of chronic conditions .  During the course of the visit the patient was educated and counseled about appropriate screening and preventive services including :  diabetes screening, lipid analysis with projected  10 year  risk for CAD , nutrition counseling, breast, cervical and colorectal cancer screening, and  recommended immunizations.  Printed recommendations for health maintenance screenings was give      Eating disorder    Her eating has become compulsive and her mother notes that she will eat to the point of developing physical distress.   Trial of Contrave recommended; will refer to Neurology for evaluation vs Psychology /Eating disorder clinic       Relevant Orders   Ambulatory referral to Neuropsychology    Other Visit Diagnoses   None.     I am having Ms. Gitto start on Naltrexone-Bupropion HCl ER. I am also having her maintain her triamcinolone cream, fexofenadine, phentermine, azelastine, citalopram, fluticasone, hydrOXYzine, TRI-PREVIFEM, multivitamin, and Multiple Vitamins-Minerals (HAIR VITAMINS PO).  Meds ordered this encounter  Medications  . Multiple Vitamin (MULTIVITAMIN) capsule    Sig: Take 1 capsule by mouth daily.  . Multiple Vitamins-Minerals (HAIR VITAMINS PO)    Sig: Take by mouth.  . Naltrexone-Bupropion HCl ER 8-90 MG TB12    Sig: One tablet every morning for one week, then twice daily for one week.. Increase gradually to 2 tablets twice daily    Dispense:  120 tablet    Refill:  0    There are no discontinued medications.  Follow-up: No Follow-up on file.   Crecencio Mc, MD

## 2015-08-16 NOTE — Assessment & Plan Note (Addendum)
Etiology is unclear but per mother occurred  During birth resulting in developmental delay.  she is unable to live independently.  She has developed an over eating disorder which has thus far been managed with phentermine but long term management will require transition to other safer medications.  Trial of contrave  Disucssed,  But will refer patient to Neuropsychiatry  for further evaluation

## 2015-08-16 NOTE — Assessment & Plan Note (Signed)
Secondary to eating disorder complicated by cognitive impairment/history of developmental delay. Screening for thyroid, diabetes has been done in the past  Lab Results  Component Value Date   TSH 2.75 06/26/2014   No results found for: HGBA1C

## 2015-08-16 NOTE — Assessment & Plan Note (Signed)
Annual comprehensive preventive exam was done as well as an evaluation and management of chronic conditions .  During the course of the visit the patient was educated and counseled about appropriate screening and preventive services including :  diabetes screening, lipid analysis with projected  10 year  risk for CAD , nutrition counseling, breast, cervical and colorectal cancer screening, and recommended immunizations.  Printed recommendations for health maintenance screenings was give 

## 2015-08-16 NOTE — Assessment & Plan Note (Addendum)
Her eating has become compulsive and her mother notes that she will eat to the point of developing physical distress.   Trial of Contrave recommended; will refer to Neurology for evaluation vs Psychology /Eating disorder clinic

## 2015-08-17 ENCOUNTER — Encounter: Payer: Self-pay | Admitting: Internal Medicine

## 2015-08-17 ENCOUNTER — Telehealth: Payer: Self-pay | Admitting: Internal Medicine

## 2015-08-17 DIAGNOSIS — F509 Eating disorder, unspecified: Secondary | ICD-10-CM

## 2015-08-17 DIAGNOSIS — R6250 Unspecified lack of expected normal physiological development in childhood: Secondary | ICD-10-CM

## 2015-08-17 DIAGNOSIS — F09 Unspecified mental disorder due to known physiological condition: Secondary | ICD-10-CM

## 2015-08-17 NOTE — Telephone Encounter (Signed)
This was a psych referral orginially, does it need to change to the neurology one?

## 2015-08-17 NOTE — Telephone Encounter (Signed)
After thinking about my last encounter with her and daughter Gabbie,  I think her eating disorder needs a more structured and comprehensive evaluation, so I have made a referral to our neurologist to start.  They may recommend an eating disorder clinic, but given her remote history of brain injury, I  Thought it best to start with neurology

## 2015-08-19 NOTE — Telephone Encounter (Signed)
done

## 2015-08-19 NOTE — Telephone Encounter (Signed)
Please change to neurology, thanks

## 2015-08-19 NOTE — Telephone Encounter (Signed)
Yes the referral needs to be changed to neurology

## 2015-08-21 ENCOUNTER — Telehealth: Payer: Self-pay

## 2015-08-21 NOTE — Telephone Encounter (Signed)
PA for Contrave completed on Cover my meds, thanks

## 2015-08-27 NOTE — Telephone Encounter (Signed)
PA for Contrave ER 8-90mg  tablet approved from 08/25/15 - 08/24/16

## 2015-09-14 ENCOUNTER — Other Ambulatory Visit: Payer: Self-pay | Admitting: Internal Medicine

## 2015-09-29 ENCOUNTER — Other Ambulatory Visit: Payer: Self-pay | Admitting: Internal Medicine

## 2015-10-03 ENCOUNTER — Other Ambulatory Visit: Payer: Self-pay | Admitting: Internal Medicine

## 2015-10-30 ENCOUNTER — Other Ambulatory Visit: Payer: Self-pay | Admitting: Internal Medicine

## 2015-11-03 NOTE — Telephone Encounter (Signed)
Last seen 08/14/15. No follow up visit on file.

## 2015-11-09 ENCOUNTER — Telehealth: Payer: Self-pay | Admitting: Internal Medicine

## 2015-11-09 NOTE — Telephone Encounter (Signed)
IN next 30 days.

## 2015-11-09 NOTE — Telephone Encounter (Signed)
PT mom called and stated that someone called her that she needed to come in for an appointment, Does this appt need to be soon or can it be booked out a while. Please advise, thank you!   Call pt @ [phone]

## 2015-11-10 ENCOUNTER — Other Ambulatory Visit: Payer: Self-pay | Admitting: Internal Medicine

## 2015-11-10 NOTE — Telephone Encounter (Signed)
Needs to be in the next month for medication follow up and refill. thnaks

## 2015-11-11 ENCOUNTER — Encounter: Payer: Self-pay | Admitting: Internal Medicine

## 2015-11-11 NOTE — Telephone Encounter (Signed)
There is an opening at 8.30 tomorrow.

## 2015-11-11 NOTE — Telephone Encounter (Signed)
That appt was not good.Scheduled for 11/6 @ 4 pm.

## 2015-11-11 NOTE — Telephone Encounter (Signed)
Please advise where to schedule. No open appointments available in the next month.

## 2015-11-30 ENCOUNTER — Ambulatory Visit (INDEPENDENT_AMBULATORY_CARE_PROVIDER_SITE_OTHER): Payer: BLUE CROSS/BLUE SHIELD | Admitting: Internal Medicine

## 2015-11-30 DIAGNOSIS — F509 Eating disorder, unspecified: Secondary | ICD-10-CM | POA: Diagnosis not present

## 2015-11-30 DIAGNOSIS — E663 Overweight: Secondary | ICD-10-CM

## 2015-11-30 DIAGNOSIS — F411 Generalized anxiety disorder: Secondary | ICD-10-CM

## 2015-11-30 MED ORDER — NALTREXONE-BUPROPION HCL ER 8-90 MG PO TB12: 1.0000 | ORAL_TABLET | Freq: Two times a day (BID) | ORAL | 2 refills | Status: DC

## 2015-11-30 NOTE — Progress Notes (Signed)
Pre-visit discussion using our clinic review tool. No additional management support is needed unless otherwise documented below in the visit note.  

## 2015-11-30 NOTE — Patient Instructions (Addendum)
I want you to start walking for 30 minutes 5 days per week !  This is NECESSARY TO manage weight  Please have TSH, comprehensive metabolic panel,  CBC w d iff,  Hgba1c and  Serum LDL  done by the RN  No fasting required   Try advancing Contrave to twice daily  if tolerated.     See you in 3 months

## 2015-11-30 NOTE — Progress Notes (Signed)
Subjective:  Patient ID: Ashlee Graves, female    DOB: 25-Sep-1985  Age: 30 y.o. MRN: [id number]  CC: Diagnoses of Overweight (BMI 25.0-29.9), Generalized anxiety disorder, and Eating disorder were pertinent to this visit.  HPI Ashlee Graves presents for FOLLOW up on on 1) GAD and 2) medical management  Of OVERWEIGHT with medication change from phentermine to Contrave.  .  She has  regained nearly  40 lbs since April  2016.  Has only gained 6 since July ,, when she started taking Contrave. However she has been taking Contrave only once daily bc of mother's fear of advancing the dose due to nausea.   She is not exercising regularly .  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 She   2) GAD: she has had an improved attitude and relationship with her sister , fro whom she was previously estranged since her marriage.    Outpatient Medications Prior to Visit  Medication Sig Dispense Refill  . azelastine (OPTIVAR) 0.05 % ophthalmic solution PLACE 1 DROP INTO BOTH EYES 2 (TWO) TIMES DAILY. 6 mL 12  . citalopram (CELEXA) 20 MG tablet TAKE 1 TABLET (20 MG TOTAL) BY MOUTH ONCE A DAY 90 tablet 1  . fexofenadine (ALLEGRA) 180 MG tablet TAKE 1 TABLET BY MOUTH DAILY 30 tablet 11  . fluticasone (FLONASE) 50 MCG/ACT nasal spray Place 2 sprays into both nostrils daily. 16 g 11  . hydrOXYzine (ATARAX/VISTARIL) 25 MG tablet TAKE 1 TABLET (25 MG TOTAL) BY MOUTH 3 (THREE) TIMES DAILY AS NEEDED FOR ITCHING. 30 tablet 5  . Multiple Vitamin (MULTIVITAMIN) capsule Take 1 capsule by mouth daily.    . Multiple Vitamins-Minerals (HAIR VITAMINS PO) Take by mouth.    . TRI-PREVIFEM 0.18/0.215/0.25 MG-35 MCG tablet TAKE 1 TABLET BY MOUTH ONCE A DAY 84 tablet 3  . triamcinolone cream (KENALOG) 0.1 % Apply 1 application topically 2 (two) times daily. 30 g 0  . CONTRAVE 8-90 MG TB12 TAKE 1 TABLET EVERY MORNING FOR 1 WEEK,THEN TWICE  DAILY FOR 1 WEEK.INCREASE GRADUALY TO 2 TAB 2X/DAY 120 tablet 0  . phentermine (ADIPEX-P) 37.5 MG tablet 1/2 tablet in the am and early afternoon (Patient not taking: Reported on 11/30/2015) 30 tablet 2   No facility-administered medications prior to visit.     Review of Systems;  Patient denies headache, fevers, malaise, unintentional weight loss, skin rash, eye pain, sinus congestion and sinus pain, sore throat, dysphagia,  hemoptysis , cough, dyspnea, wheezing, chest pain, palpitations, orthopnea, edema, abdominal pain, nausea, melena, diarrhea, constipation, flank pain, dysuria, hematuria, urinary  Frequency, nocturia, numbness, tingling, seizures,  Focal weakness, Loss of consciousness,  Tremor, insomnia, depression, anxiety, and suicidal ideation.      Objective:  BP 118/74   Pulse (!) 57   Temp 97.8 F (36.6 C) (Oral)   Resp 12   Ht 5\' 3"  (1.6 m)   Wt 159 lb 4 oz (72.2 kg)   LMP 11/25/2015   SpO2 99%   BMI 28.21 kg/m   BP Readings from Last 3 Encounters:  11/30/15 118/74  08/14/15 112/84  04/16/15 120/78    Wt Readings from Last 3 Encounters:  11/30/15 159 lb 4 oz (72.2 kg)  08/14/15 153 lb (69.4 kg)  04/16/15 143 lb 12 oz (65.2 kg)    General appearance: alert, cooperative and appears stated age Ears: normal TM's and external ear  canals both ears Throat: lips, mucosa, and tongue normal; teeth and gums normal Neck: no adenopathy, no carotid bruit, supple, symmetrical, trachea midline and thyroid not enlarged, symmetric, no tenderness/mass/nodules Back: symmetric, no curvature. ROM normal. No CVA tenderness. Lungs: clear to auscultation bilaterally Heart: regular rate and rhythm, S1, S2 normal, no murmur, click, rub or gallop Abdomen: soft, non-tender; bowel sounds normal; no masses,  no organomegaly Pulses: 2+ and symmetric Skin: Skin color, texture, turgor normal. No rashes or lesions Lymph nodes: Cervical, supraclavicular, and axillary nodes normal.  No  results found for: HGBA1C  Lab Results  Component Value Date   CREATININE 0.6 06/26/2014   CREATININE 0.6 06/28/2013   CREATININE 0.6 07/06/2012    Lab Results  Component Value Date   WBC 5.8 06/26/2014   HGB 12.4 06/26/2014   HCT 36 06/26/2014   PLT 239 06/26/2014   CHOL 142 06/26/2014   TRIG 43 06/26/2014   HDL 65 06/26/2014   LDLCALC 68 06/26/2014   ALT 14 06/26/2014   AST 20 06/26/2014   NA 138 06/26/2014   K 5.1 06/26/2014   CREATININE 0.6 06/26/2014   BUN 10 06/26/2014   TSH 2.75 06/26/2014    No results found.  Assessment & Plan:   Problem List Items Addressed This Visit    Overweight (BMI 25.0-29.9)    advised to increase Contrave to bid, and resume regular exercise.       Generalized anxiety disorder    Improved with celexa , no changes today       Eating disorder    Her eating is compulsive and her mother has noted that she will eat to the point of developing physical distress.   She was referred to Neurology for evaluation but her mother declined the referral.        A total of 25 minutes of face to face time was spent with patient more than half of which was spent in counselling about the above mentioned conditions  and coordination of care   I have discontinued Ms. Pryer's phentermine. I have also changed her CONTRAVE to Naltrexone-Bupropion HCl ER. Additionally, I am having her maintain her triamcinolone cream, azelastine, fluticasone, TRI-PREVIFEM, multivitamin, Multiple Vitamins-Minerals (HAIR VITAMINS PO), fexofenadine, citalopram, and hydrOXYzine.  Meds ordered this encounter  Medications  . Naltrexone-Bupropion HCl ER (CONTRAVE) 8-90 MG TB12    Sig: Take 1 tablet by mouth 2 (two) times daily.    Dispense:  60 tablet    Refill:  2    Medications Discontinued During This Encounter  Medication Reason  . phentermine (ADIPEX-P) 37.5 MG tablet Change in therapy  . CONTRAVE 8-90 MG TB12 Reorder    Follow-up: Return in about 3 months  (around 03/01/2016).   Crecencio Mc, MD

## 2015-12-02 NOTE — Assessment & Plan Note (Signed)
Improved with celexa , no changes today  

## 2015-12-02 NOTE — Assessment & Plan Note (Signed)
Her eating is compulsive and her mother has noted that she will eat to the point of developing physical distress.   She was referred to Neurology for evaluation but her mother declined the referral.

## 2015-12-02 NOTE — Assessment & Plan Note (Signed)
advised to increase Contrave to bid, and resume regular exercise.

## 2015-12-17 LAB — CBC AND DIFFERENTIAL
HCT: 40 % (ref 36–46)
Hemoglobin: 13.4 g/dL (ref 12.0–16.0)
Neutrophils Absolute: 3 /uL
Platelets: 13 10*3/uL — AB (ref 150–399)
WBC: 5.9 10^3/mL

## 2015-12-17 LAB — HEPATIC FUNCTION PANEL
ALT: 11 U/L (ref 7–35)
AST: 17 U/L (ref 13–35)
Alkaline Phosphatase: 46 U/L (ref 25–125)
BILIRUBIN, TOTAL: 0.2 mg/dL

## 2015-12-17 LAB — BASIC METABOLIC PANEL
BUN: 16 mg/dL (ref 4–21)
Creatinine: 0.6 mg/dL (ref 0.5–1.1)
GLUCOSE: 75 mg/dL
Potassium: 5 mmol/L (ref 3.4–5.3)
Sodium: 143 mmol/L (ref 137–147)

## 2015-12-17 LAB — HEMOGLOBIN A1C: Hemoglobin A1C: 4.9

## 2015-12-17 LAB — TSH: TSH: 3.04 u[IU]/mL (ref 0.41–5.90)

## 2015-12-17 LAB — LIPID PANEL: LDL Cholesterol: 101 mg/dL

## 2015-12-24 ENCOUNTER — Telehealth: Payer: Self-pay | Admitting: Internal Medicine

## 2016-01-12 ENCOUNTER — Encounter: Payer: Self-pay | Admitting: Internal Medicine

## 2016-01-24 ENCOUNTER — Other Ambulatory Visit: Payer: Self-pay | Admitting: Internal Medicine

## 2016-02-02 ENCOUNTER — Encounter: Payer: Self-pay | Admitting: Internal Medicine

## 2016-03-01 ENCOUNTER — Encounter: Payer: Self-pay | Admitting: Internal Medicine

## 2016-03-01 ENCOUNTER — Ambulatory Visit (INDEPENDENT_AMBULATORY_CARE_PROVIDER_SITE_OTHER): Payer: BLUE CROSS/BLUE SHIELD | Admitting: Internal Medicine

## 2016-03-01 DIAGNOSIS — F509 Eating disorder, unspecified: Secondary | ICD-10-CM

## 2016-03-01 DIAGNOSIS — E663 Overweight: Secondary | ICD-10-CM

## 2016-03-01 NOTE — Progress Notes (Signed)
Pre visit review using our clinic review tool, if applicable. No additional management support is needed unless otherwise documented below in the visit note. 

## 2016-03-01 NOTE — Patient Instructions (Signed)
The contrave was approved for a full year last August on Cover My Meds    Let pharmacy and blu e Elinor Parkinson know this  In the interim,  Go back to using citrucel or miralax , metamucil  or fibercon  With an 8 ounce glass of water ,  30 minutes  before meals to fil tummy up   2 times daily maximum

## 2016-03-01 NOTE — Progress Notes (Signed)
Subjective:  Patient ID: Ashlee Graves, female    DOB: Oct 27, 1985  Age: 31 y.o. MRN: [id number]  CC: Diagnoses of Eating disorder and Overweight (BMI 25.0-29.9) were pertinent to this visit.  HPI Ashlee Graves presents for weight management complicated by developmental delay and lack of adequate supervision .  Patient has an eating disorder that has only been controlled with continuous use of phentermine .  At her last visit contrave was started and prior authorization was obtained and approval for one year was granted.  However, her mother states that the medication was denied by her insurance. she has been without it for two weeks  And she has gained 4 lb since last visit,  And 20 lb over the  past  Year.  Her family has not been very supportive, and her mother works so she is not supervising her snacking.       Outpatient Medications Prior to Visit  Medication Sig Dispense Refill  . azelastine (OPTIVAR) 0.05 % ophthalmic solution PLACE 1 DROP INTO BOTH EYES 2 (TWO) TIMES DAILY. 6 mL 12  . citalopram (CELEXA) 20 MG tablet TAKE 1 TABLET (20 MG TOTAL) BY MOUTH ONCE A DAY 90 tablet 1  . fexofenadine (ALLEGRA) 180 MG tablet TAKE 1 TABLET BY MOUTH DAILY 30 tablet 11  . fluticasone (FLONASE) 50 MCG/ACT nasal spray Place 2 sprays into both nostrils daily. 16 g 11  . hydrOXYzine (ATARAX/VISTARIL) 25 MG tablet TAKE 1 TABLET (25 MG TOTAL) BY MOUTH 3 (THREE) TIMES DAILY AS NEEDED FOR ITCHING. 30 tablet 5  . Multiple Vitamin (MULTIVITAMIN) capsule Take 1 capsule by mouth daily.    . Multiple Vitamins-Minerals (HAIR VITAMINS PO) Take by mouth.    . TRI-PREVIFEM 0.18/0.215/0.25 MG-35 MCG tablet TAKE 1 TABLET BY MOUTH ONCE A DAY 84 tablet 3  . triamcinolone cream (KENALOG) 0.1 % Apply 1 application topically 2 (two) times daily. 30 g 0   No facility-administered medications prior to visit.     Review of Systems;  Patient denies headache, fevers, malaise, unintentional weight loss, skin rash, eye  pain, sinus congestion and sinus pain, sore throat, dysphagia,  hemoptysis , cough, dyspnea, wheezing, chest pain, palpitations, orthopnea, edema, abdominal pain, nausea, melena, diarrhea, constipation, flank pain, dysuria, hematuria, urinary  Frequency, nocturia, numbness, tingling, seizures,  Focal weakness, Loss of consciousness,  Tremor, insomnia, depression, anxiety, and suicidal ideation.      Objective:  BP 122/80   Pulse 64   Resp 16   Wt 163 lb (73.9 kg)   SpO2 99%   BMI 28.87 kg/m   BP Readings from Last 3 Encounters:  03/01/16 122/80  11/30/15 118/74  08/14/15 112/84    Wt Readings from Last 3 Encounters:  03/01/16 163 lb (73.9 kg)  11/30/15 159 lb 4 oz (72.2 kg)  08/14/15 153 lb (69.4 kg)    General appearance: alert, cooperative and appears stated age Ears: normal TM's and external ear canals both ears Throat: lips, mucosa, and tongue normal; teeth and gums normal Neck: no adenopathy, no carotid bruit, supple, symmetrical, trachea midline and thyroid not enlarged, symmetric, no tenderness/mass/nodules Back: symmetric, no curvature. ROM normal. No CVA tenderness. Lungs: clear to auscultation bilaterally Heart: regular rate and rhythm, S1, S2 normal, no murmur, click, rub or gallop Abdomen: soft, non-tender; bowel sounds normal; no masses,  no organomegaly Pulses: 2+ and symmetric Skin: Skin color, texture, turgor normal. No rashes or lesions Lymph nodes: Cervical, supraclavicular, and axillary nodes normal.  Lab Results  Component  Value Date   HGBA1C 4.9 12/17/2015    Lab Results  Component Value Date   CREATININE 0.6 12/17/2015   CREATININE 0.6 06/26/2014   CREATININE 0.6 06/28/2013    Lab Results  Component Value Date   WBC 5.9 12/17/2015   HGB 13.4 12/17/2015   HCT 40 12/17/2015   PLT 13 (A) 12/17/2015   CHOL 142 06/26/2014   TRIG 43 06/26/2014   HDL 65 06/26/2014   LDLCALC 101 12/17/2015   ALT 11 12/17/2015   AST 17 12/17/2015   NA 143  12/17/2015   K 5.0 12/17/2015   CREATININE 0.6 12/17/2015   BUN 16 12/17/2015   TSH 3.04 12/17/2015   HGBA1C 4.9 12/17/2015    No results found.  Assessment & Plan:   Problem List Items Addressed This Visit    Eating disorder    The contrave was approved for a year in august .  Will resume medication .  Advised to use metamucil before two meals daily until the medication can be obtained. Given her cognitive limitations, I counselled her mother about increasing her family's involvement.       Overweight (BMI 25.0-29.9)    advised to resume Contrave to bid, and resume regular exercise.          I am having Ms. Swilling maintain her triamcinolone cream, fluticasone, TRI-PREVIFEM, multivitamin, Multiple Vitamins-Minerals (HAIR VITAMINS PO), fexofenadine, citalopram, hydrOXYzine, and azelastine.  No orders of the defined types were placed in this encounter.   There are no discontinued medications.  Follow-up: Return after July 21  annual with pelvic .   Crecencio Mc, MD

## 2016-03-02 NOTE — Assessment & Plan Note (Signed)
advised to resume Contrave to bid, and resume regular exercise.

## 2016-03-02 NOTE — Assessment & Plan Note (Addendum)
The contrave was approved for a year in august .  Will resume medication .  Advised to use metamucil before two meals daily until the medication can be obtained. Given her cognitive limitations, I counselled her mother about increasing her family's involvement.

## 2016-03-08 DIAGNOSIS — D229 Melanocytic nevi, unspecified: Secondary | ICD-10-CM | POA: Diagnosis not present

## 2016-03-08 DIAGNOSIS — K130 Diseases of lips: Secondary | ICD-10-CM | POA: Diagnosis not present

## 2016-03-08 DIAGNOSIS — L814 Other melanin hyperpigmentation: Secondary | ICD-10-CM | POA: Diagnosis not present

## 2016-03-08 DIAGNOSIS — Z1283 Encounter for screening for malignant neoplasm of skin: Secondary | ICD-10-CM | POA: Diagnosis not present

## 2016-03-30 ENCOUNTER — Other Ambulatory Visit: Payer: Self-pay | Admitting: Internal Medicine

## 2016-04-17 ENCOUNTER — Other Ambulatory Visit: Payer: Self-pay | Admitting: Internal Medicine

## 2016-04-22 ENCOUNTER — Other Ambulatory Visit: Payer: Self-pay | Admitting: Internal Medicine

## 2016-07-10 ENCOUNTER — Other Ambulatory Visit: Payer: Self-pay | Admitting: Internal Medicine

## 2016-08-15 ENCOUNTER — Encounter: Payer: Self-pay | Admitting: Internal Medicine

## 2016-08-15 ENCOUNTER — Ambulatory Visit (INDEPENDENT_AMBULATORY_CARE_PROVIDER_SITE_OTHER): Payer: BLUE CROSS/BLUE SHIELD | Admitting: Internal Medicine

## 2016-08-15 VITALS — BP 120/74 | HR 69 | Temp 98.5°F | Resp 17 | Ht 63.0 in | Wt 163.2 lb

## 2016-08-15 DIAGNOSIS — L981 Factitial dermatitis: Secondary | ICD-10-CM | POA: Diagnosis not present

## 2016-08-15 DIAGNOSIS — J309 Allergic rhinitis, unspecified: Secondary | ICD-10-CM

## 2016-08-15 DIAGNOSIS — F509 Eating disorder, unspecified: Secondary | ICD-10-CM

## 2016-08-15 DIAGNOSIS — F09 Unspecified mental disorder due to known physiological condition: Secondary | ICD-10-CM

## 2016-08-15 DIAGNOSIS — Z0000 Encounter for general adult medical examination without abnormal findings: Secondary | ICD-10-CM | POA: Diagnosis not present

## 2016-08-15 MED ORDER — LIRAGLUTIDE -WEIGHT MANAGEMENT 18 MG/3ML ~~LOC~~ SOPN: 0.6000 mg | PEN_INJECTOR | Freq: Every day | SUBCUTANEOUS | 0 refills | Status: DC

## 2016-08-15 MED ORDER — PEN NEEDLES 31G X 6 MM MISC: 0 refills | Status: DC

## 2016-08-15 NOTE — Patient Instructions (Addendum)
am recommending a trial of the medication called Saxenda to help Ashlee Graves  lose weight.  It is similar to a a medicine that is used to treat diabetes called Victoza,  But it does not lower  blood sugars if you do not have diabetes. .   It is injected daily in incrementally increasing doses (if tolerated,  Nausea usually resolves in a few days)"  0.6 mg daily   Week 1 1.2 mg daily Week 2 1.8 mg  Daly Week 3 2.4 mg daily Week 4 3.0 mg daily Week 5 and ongoing   If you want to bring the pen with you to be shown how to give yourself the dose,  We wil make Ashlee Graves  an  RN visit once you pick up your medication from your pharmacy   There is a physical therapist who works out at [employer].   Her name is Ashlee Graves.

## 2016-08-15 NOTE — Progress Notes (Signed)
Patient ID: Ashlee Graves, female    DOB: 10/11/1985  Age: 31 y.o. MRN: [id number]  The patient is here for annual preventive examination and management of other chronic and acute problems.  She has a learning disability and Is accompanied by her mother.   Patient has been celibate since birth per mother and self reporting . Lives with parents, no romantic interests.  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   No PAP required    The risk factors are reflected in the social history.  The roster of all physicians providing medical care to patient - is listed in the Snapshot section of the chart.  Activities of daily living:  The patient is 100% independent in all ADLs: dressing, toileting, feeding as well as independent mobility, but is mentally incompetent to live alone  And lives with her mother.   Home safety : The patient has smoke detectors in the home. They wear seatbelts.  There are no firearms at home. There is no violence in the home.   There is no risks for hepatitis, STDs or HIV. There is no   history of blood transfusion. They have no travel history to infectious disease endemic areas of the world.  The patient has seen their dentist in the last six month. They have seen their eye doctor in the last year.   They do not  have excessive sun exposure. Discussed the need for sun protection: hats, long sleeves and use of sunscreen if there is significant sun exposure.   Diet: the importance of a healthy diet is discussed. she continues to struggle with overeating, which is complicated by her learning disability and her craving os sweets and junk food. Which is enabled by her peers at her job.  Long term use of phentermine is preferred by her mother but not ideal per repeat discussion today.  She has not achieved substantial results from trial of Contrave.  Discussed trial of saxenda if  Approved by insurance     The  benefits of regular aerobic exercise were discussed. She has not been exercising regularly.    Depression screen: there are no signs or vegative symptoms of depression- irritability, change in appetite, anhedonia, sadness/tearfullness.  Cognitive assessment: the patient does not managed any of her financial and personal affairs and is actively engaged. They could not relate day,date,year and events.   The following portions of the patient's history were reviewed and updated as appropriate: allergies, current medications, past family history, past medical history,  past surgical history, past social history  and problem list.  Visual acuity was not assessed per patient preference since she has regular follow up with her ophthalmologist. Hearing and body mass index were assessed and reviewed.   During the course of the visit the patient was educated and counseled about appropriate screening and preventive services including : fall prevention , diabetes screening, nutrition counseling, colorectal cancer screening, and recommended immunizations.    CC: Diagnoses of Acquired cognitive dysfunction, Allergic rhinitis, unspecified seasonality, unspecified trigger, Eating disorder, Excoriation, neurotic, and Visit for preventive health examination were pertinent to this visit.   1) weight gain.  Prescribed Contrave at last visit in February taking it twice daily but not losing weight. Father had a thrombotic left brain  CVA in April, [facility] . Father has heriditary hyerplipidemia and found to have herediatr .bilateral carotid artery stenosis. 100% occluded on the left  Carotid but the right carotid was stented.  Non smoker.    Family has been very stressed out. .Mother is supervising meals when home,  At school on [place] and [place].  Still working at [employer] and the coworkers are enabling her poor eating habits .    2) recent ER visit June 28. Mother denies any knowledge of visit,  As she and patient  were out of town at the time.  No labs done   3) balance issues.  She has had SEVERAL MINOR FALLS RECENTLY .  Per mother has had poor balance since she was a child. Mother  thinks her legs have become weaker since she stopped exercising on a regular basis.    History Heather has a past medical history of Allergy; Depression; and Developmental delay, moderate.   She has no past surgical history on file.   Her family history includes Cancer in her maternal grandfather, maternal grandmother, and paternal grandfather; Hyperlipidemia in her father; Hypertension in her father.She reports that she has never smoked. She has never used smokeless tobacco. She reports that she drinks alcohol. She reports that she does not use drugs.  Outpatient Medications Prior to Visit  Medication Sig Dispense Refill  . azelastine (OPTIVAR) 0.05 % ophthalmic solution PLACE 1 DROP INTO BOTH EYES 2 (TWO) TIMES DAILY. 6 mL 12  . citalopram (CELEXA) 20 MG tablet TAKE 1 TABLET (20 MG TOTAL) BY MOUTH ONCE A DAY 90 tablet 1  . fexofenadine (ALLEGRA) 180 MG tablet TAKE 1 TABLET BY MOUTH DAILY 30 tablet 11  . fluticasone (FLONASE) 50 MCG/ACT nasal spray Place 2 sprays into both nostrils daily. 16 g 11  . hydrOXYzine (ATARAX/VISTARIL) 25 MG tablet TAKE 1 TABLET (25 MG TOTAL) BY MOUTH 3 (THREE) TIMES DAILY AS NEEDED FOR ITCHING. 30 tablet 5  . Multiple Vitamin (MULTIVITAMIN) capsule Take 1 capsule by mouth daily.    . Multiple Vitamins-Minerals (HAIR VITAMINS PO) Take by mouth.    . TRI-PREVIFEM 0.18/0.215/0.25 MG-35 MCG tablet TAKE 1 TABLET BY MOUTH ONCE A DAY 84 tablet 3  . triamcinolone cream (KENALOG) 0.1 % Apply 1 application topically 2 (two) times daily. 30 g 0   No facility-administered medications prior to visit.     Review of Systems   Patient denies headache, fevers, malaise, unintentional weight loss, skin rash, eye pain, sinus congestion and sinus pain, sore throat, dysphagia,  hemoptysis , cough, dyspnea,  wheezing, chest pain, palpitations, orthopnea, edema, abdominal pain, nausea, melena, diarrhea, constipation, flank pain, dysuria, hematuria, urinary  Frequency, nocturia, numbness, tingling, seizures,  Focal weakness, Loss of consciousness,  Tremor, insomnia, depression, anxiety, and suicidal ideation.      Objective:  BP 120/74 (BP Location: Left Arm, Patient Position: Sitting, Cuff Size: Normal)   Pulse 69   Temp 98.5 F (36.9 C) (Oral)   Resp 17   Ht 5\' 3"  (1.6 m)   Wt 163 lb 3.2 oz (74 kg)   SpO2 100%   BMI 28.91 kg/m   Physical Exam   General appearance: alert, cooperative and appears stated age Head: Normocephalic, without obvious abnormality, atraumatic Eyes: conjunctivae/corneas clear. PERRL, EOM's intact. Fundi benign. Ears: normal TM's and external ear canals both ears Nose: Nares normal. Septum midline. Mucosa normal. No drainage or sinus tenderness. Throat: lips, mucosa, and tongue normal; teeth and gums normal Neck: no adenopathy, no carotid bruit, no JVD, supple, symmetrical, trachea midline and thyroid not enlarged, symmetric, no tenderness/mass/nodules Lungs: clear to auscultation bilaterally Breasts: normal appearance,  no masses or tenderness Heart: regular rate and rhythm, S1, S2 normal, no murmur, click, rub or gallop Abdomen: soft, non-tender; bowel sounds normal; no masses,  no organomegaly Extremities: extremities normal, atraumatic, no cyanosis or edema Pulses: 2+ and symmetric Skin: Skin color, texture, turgor normal. No rashes or lesions Neurologic: Alert and oriented X 3, normal strength and tone. Normal symmetric reflexes. Normal coordination and gait.  no pronator driff,  Negative Romberg sing,      Assessment & Plan:   Problem List Items Addressed This Visit    Acquired cognitive dysfunction    Likely occurred at birth.  Her lack of competency and judgement impairs her ability to maintain a health weight due to overeating.        Allergic  rhinitis    Continue allegra, flonase,  And azelastine eye drops       Eating disorder    No results with contrave.  Trial of saxenda recommended  Prior success with phentermine noted,  But risks of long term use reviewed.  Tiral of liraglutide offered.  Will consider trial of belviq if saxenda is not tolerated or not an option .Marland Kitchen Will need to stop citalopram if belviq is started since it is a serotonin receptor agonist       Excoriation, neurotic    Improved with management of GAD.  continue prn hydroxyzine       Visit for preventive health examination    Annual comprehensive preventive exam was done as well as an evaluation and management of chronic conditions .  During the course of the visit the patient was educated and counseled about appropriate screening and preventive services including :  diabetes screening, lipid analysis with projected  10 year  risk for CAD , nutrition counseling, breast, cervical and colorectal cancer screening, and recommended immunizations.  Printed recommendations for health maintenance screenings was given and fasting labs to be done at [facility] per mother.          I am having Ms. Wiechman start on Liraglutide -Weight Management and Pen Needles. I am also having her maintain her triamcinolone cream, fluticasone, multivitamin, Multiple Vitamins-Minerals (HAIR VITAMINS PO), fexofenadine, hydrOXYzine, azelastine, citalopram, TRI-PREVIFEM, and Naltrexone-Bupropion HCl (CONTRAVE PO).  Meds ordered this encounter  Medications  . Naltrexone-Bupropion HCl (CONTRAVE PO)    Sig: Take by mouth.  . Liraglutide -Weight Management (SAXENDA) 18 MG/3ML SOPN    Sig: Inject 0.6 mg into the skin daily. Increase dose weekly as follows: Week 2: 1.2 mg daily ; Week 3: 1.8 mg daily; Week 4: 2.4 mg daily    Dispense:  9 mL    Refill:  0  . Insulin Pen Needle (PEN NEEDLES) 31G X 6 MM MISC    Sig: For use with victoza /saxenda    Dispense:  100 each    Refill:  0    There  are no discontinued medications.  Follow-up: No Follow-up on file.   Crecencio Mc, MD

## 2016-08-16 NOTE — Assessment & Plan Note (Addendum)
No results with contrave.  Trial of saxenda recommended  Prior success with phentermine noted,  But risks of long term use reviewed.  Tiral of liraglutide offered.  Will consider trial of belviq if saxenda is not tolerated or not an option .Marland Kitchen Will need to stop citalopram if belviq is started since it is a serotonin receptor agonist

## 2016-08-16 NOTE — Assessment & Plan Note (Signed)
Improved with management of GAD.  continue prn hydroxyzine

## 2016-08-16 NOTE — Assessment & Plan Note (Addendum)
Annual comprehensive preventive exam was done as well as an evaluation and management of chronic conditions .  During the course of the visit the patient was educated and counseled about appropriate screening and preventive services including :  diabetes screening, lipid analysis with projected  10 year  risk for CAD , nutrition counseling, breast, cervical and colorectal cancer screening, and recommended immunizations.  Printed recommendations for health maintenance screenings was given and fasting labs to be done at [facility] per mother.

## 2016-08-16 NOTE — Assessment & Plan Note (Signed)
Continue allegra, flonase,  And azelastine eye drops

## 2016-08-16 NOTE — Assessment & Plan Note (Signed)
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  Her lack of competency and judgement impairs her ability to maintain a health weight due to overeating.

## 2016-08-25 ENCOUNTER — Telehealth: Payer: Self-pay | Admitting: Internal Medicine

## 2016-08-25 LAB — BASIC METABOLIC PANEL
BUN: 8 (ref 4–21)
Creatinine: 0.6 (ref <=1.1)
Glucose: 75
POTASSIUM: 4.4 (ref 3.4–5.3)
Sodium: 139 (ref 137–147)

## 2016-08-25 LAB — LIPID PANEL
CHOLESTEROL: 169 (ref 0–200)
HDL: 57 (ref 35–70)
LDL CALC: 97
Triglycerides: 74 (ref 40–160)

## 2016-08-25 LAB — HEPATIC FUNCTION PANEL
ALT: 14 (ref 7–35)
AST: 19 (ref 13–35)
Alkaline Phosphatase: 52 (ref 25–125)

## 2016-08-25 LAB — TSH: TSH: 3.7 (ref <=5.90)

## 2016-08-25 LAB — VITAMIN D 25 HYDROXY (VIT D DEFICIENCY, FRACTURES): VIT D 25 HYDROXY: 36.5

## 2016-08-25 LAB — HEMOGLOBIN A1C: Hemoglobin A1C: 5.1

## 2016-08-25 LAB — VITAMIN B12: Vitamin B-12: 663

## 2016-08-25 NOTE — Telephone Encounter (Signed)
Started PA for saxenda on cover my meds.

## 2016-08-26 ENCOUNTER — Telehealth: Payer: Self-pay | Admitting: Internal Medicine

## 2016-08-26 NOTE — Telephone Encounter (Signed)
My Chart message sent

## 2016-08-29 ENCOUNTER — Other Ambulatory Visit: Payer: Self-pay

## 2016-09-23 ENCOUNTER — Other Ambulatory Visit: Payer: Self-pay | Admitting: Internal Medicine

## 2016-10-01 ENCOUNTER — Other Ambulatory Visit: Payer: Self-pay | Admitting: Internal Medicine

## 2016-10-20 NOTE — Telephone Encounter (Signed)
Error

## 2016-12-04 ENCOUNTER — Other Ambulatory Visit: Payer: Self-pay | Admitting: Internal Medicine

## 2016-12-23 ENCOUNTER — Other Ambulatory Visit: Payer: Self-pay | Admitting: Internal Medicine

## 2017-01-04 ENCOUNTER — Other Ambulatory Visit: Payer: Self-pay | Admitting: Internal Medicine

## 2017-01-18 ENCOUNTER — Telehealth: Payer: Self-pay | Admitting: Internal Medicine

## 2017-01-18 NOTE — Telephone Encounter (Signed)
Received notification that [facility] is no longer covering Contrave as of January 1  for management of weight  Please let Ashlee Graves's mother know that she will have to pay full price unless she is willing to try Belviq (similar to phentermine) or Saxenda (the injectible medication )  . If we try Belqviq,  Her antidepressant dose will need to be reduced to 10 mg.

## 2017-01-20 NOTE — Telephone Encounter (Signed)
LMTCB. PEC may speak with pt.  

## 2017-01-23 ENCOUNTER — Telehealth: Payer: Self-pay | Admitting: Internal Medicine

## 2017-01-23 MED ORDER — CITALOPRAM HYDROBROMIDE 20 MG PO TABS: 10.0000 mg | ORAL_TABLET | Freq: Every day | ORAL | 1 refills | Status: DC

## 2017-01-23 MED ORDER — LORCASERIN HCL 10 MG PO TABS: 1.0000 | ORAL_TABLET | Freq: Two times a day (BID) | ORAL | 2 refills | Status: DC

## 2017-01-23 NOTE — Telephone Encounter (Signed)
I do.  belviq is similar to phentermine . Take 1-2 times daily ,  Citalopram dose reducd to 10 ,g .  Sending both to [place]

## 2017-01-23 NOTE — Telephone Encounter (Signed)
Pt's mother called and Dr. Lupita Dawn message was given to her. She stated that if Dr. Derrel Nip thinks that [name] would do ok with the Belviq , she is willing to try it. And also she is ok with the reduction in her antidepressant.

## 2017-01-25 NOTE — Telephone Encounter (Signed)
Patient  Notified and voiced understanding.

## 2017-02-07 ENCOUNTER — Telehealth: Payer: Self-pay

## 2017-02-07 NOTE — Telephone Encounter (Signed)
PA for belviq has been submitted on cevermymeds and approved. Pt's mother has been notified and stated that she would contact the pharmacy so they would go ahead and fill the medication for pt.

## 2017-02-21 NOTE — Telephone Encounter (Signed)
Pt has been switched to belviq and her antidepressant medication has been reduced.

## 2017-03-20 DIAGNOSIS — L814 Other melanin hyperpigmentation: Secondary | ICD-10-CM | POA: Diagnosis not present

## 2017-03-20 DIAGNOSIS — L819 Disorder of pigmentation, unspecified: Secondary | ICD-10-CM | POA: Diagnosis not present

## 2017-03-20 DIAGNOSIS — Z1283 Encounter for screening for malignant neoplasm of skin: Secondary | ICD-10-CM | POA: Diagnosis not present

## 2017-03-20 DIAGNOSIS — D229 Melanocytic nevi, unspecified: Secondary | ICD-10-CM | POA: Diagnosis not present

## 2017-03-24 ENCOUNTER — Other Ambulatory Visit: Payer: Self-pay | Admitting: Internal Medicine

## 2017-07-16 ENCOUNTER — Other Ambulatory Visit: Payer: Self-pay | Admitting: Internal Medicine

## 2017-09-03 ENCOUNTER — Other Ambulatory Visit: Payer: Self-pay | Admitting: Internal Medicine

## 2017-09-05 ENCOUNTER — Telehealth: Payer: Self-pay | Admitting: Internal Medicine

## 2017-09-05 NOTE — Telephone Encounter (Signed)
Pt mom dropped off [employer] dependent form to be filled out placed in Dr. Vickey Huger folder upfront  Please contact Abigail Butts at [phone] when complete

## 2017-09-06 DIAGNOSIS — Z0279 Encounter for issue of other medical certificate: Secondary | ICD-10-CM

## 2017-09-06 NOTE — Telephone Encounter (Signed)
Form has been placed in red folder.  

## 2017-09-07 NOTE — Telephone Encounter (Signed)
The 2 page form is complete.  Charge is $50 ,  Red folder

## 2017-09-07 NOTE — Telephone Encounter (Signed)
Left message with mother letting her know that the forms have been completed and placed up front for pick up.

## 2017-09-18 DIAGNOSIS — D225 Melanocytic nevi of trunk: Secondary | ICD-10-CM | POA: Diagnosis not present

## 2017-09-18 DIAGNOSIS — L812 Freckles: Secondary | ICD-10-CM | POA: Diagnosis not present

## 2017-09-18 DIAGNOSIS — L819 Disorder of pigmentation, unspecified: Secondary | ICD-10-CM | POA: Diagnosis not present

## 2017-10-06 ENCOUNTER — Other Ambulatory Visit: Payer: Self-pay | Admitting: Internal Medicine

## 2017-10-06 ENCOUNTER — Other Ambulatory Visit (HOSPITAL_COMMUNITY)
Admission: RE | Admit: 2017-10-06 | Discharge: 2017-10-06 | Disposition: A | Discharge status: Home / Self Care | Payer: BLUE CROSS/BLUE SHIELD | Source: Ambulatory Visit | Attending: Internal Medicine | Admitting: Internal Medicine

## 2017-10-06 ENCOUNTER — Encounter: Payer: Self-pay | Admitting: Internal Medicine

## 2017-10-06 ENCOUNTER — Ambulatory Visit (INDEPENDENT_AMBULATORY_CARE_PROVIDER_SITE_OTHER): Payer: BLUE CROSS/BLUE SHIELD | Admitting: Internal Medicine

## 2017-10-06 VITALS — BP 108/82 | HR 61 | Temp 98.8°F | Resp 15 | Ht 63.0 in | Wt 145.4 lb

## 2017-10-06 DIAGNOSIS — F5089 Other specified eating disorder: Secondary | ICD-10-CM | POA: Diagnosis not present

## 2017-10-06 DIAGNOSIS — L981 Factitial dermatitis: Secondary | ICD-10-CM

## 2017-10-06 DIAGNOSIS — Z124 Encounter for screening for malignant neoplasm of cervix: Secondary | ICD-10-CM

## 2017-10-06 MED ORDER — LORCASERIN HCL 10 MG PO TABS: 1.0000 | ORAL_TABLET | Freq: Two times a day (BID) | ORAL | 2 refills | Status: DC

## 2017-10-06 MED ORDER — HYDROXYZINE HCL 25 MG PO TABS: 25.0000 mg | ORAL_TABLET | Freq: Three times a day (TID) | ORAL | 5 refills | Status: DC | PRN

## 2017-10-06 NOTE — Telephone Encounter (Signed)
Pt has appt today at 3pm. Last seen in July 2018

## 2017-10-06 NOTE — Progress Notes (Signed)
Patient ID: Ashlee Graves, female    DOB: March 15, 1985  Age: 32 y.o. MRN: [id number]  The patient is here for  management of other chronic and acute problems.   The risk factors are reflected in the social history.  The roster of all physicians providing medical care to patient - is listed in the Snapshot section of the chart.  Activities of daily living:  The patient is 100% independent in all ADLs: dressing, toileting, feeding as well as independent mobility  Home safety : The patient has smoke detectors in the home. They wear seatbelts.  There are no firearms at home. There is no violence in the home.   There is no risks for hepatitis, STDs or HIV. There is no   history of blood transfusion. They have no travel history to infectious disease endemic areas of the world.  The patient has seen their dentist in the last six month. They have seen their eye doctor in the last year. They admit to slight hearing difficulty with regard to whispered voices and some television programs.  They have deferred audiologic testing in the last year.  They do not  have excessive sun exposure. Discussed the need for sun protection: hats, long sleeves and use of sunscreen if there is significant sun exposure.   Diet: the importance of a healthy diet is discussed. They do have a healthy diet.  The benefits of regular aerobic exercise were discussed. She walks 4 times per week ,  20 minutes.   Depression screen: there are no signs or vegative symptoms of depression- irritability, change in appetite, anhedonia, sadness/tearfullness.  Cognitive assessment: the patient cannot manage her financial and personal affairs and is actively engaged. She remains dependent on her family due to cognitive dysfunction which occurred during childhood.    The following portions of the patient's history were reviewed and updated as appropriate: allergies, current medications, past family history, past medical history,  past surgical  history, past social history  and problem list.  Visual acuity was not assessed per patient preference since she has regular follow up with her ophthalmologist. Hearing and body mass index were assessed and reviewed.   During the course of the visit the patient was educated and counseled about appropriate screening and preventive services including : fall prevention , diabetes screening, nutrition counseling, colorectal cancer screening, and recommended immunizations.    CC: The primary encounter diagnosis was Cervical cancer screening. Diagnoses of Excoriation, neurotic and Other disorder of eating were also pertinent to this visit.  Eating disorder:  She has gained weight since running out of Belviq,  Saxenda and Contrave trials were unsuccessful for various reason  Pruritus:  Mother reports that shen she becomes anxious she develops neurotic itching of arms, legs and trunk .    History Ashlee Graves has a past medical history of Allergy, Depression, and Developmental delay, moderate.   She has no past surgical history on file.   Her family history includes Cancer in her maternal grandfather, maternal grandmother, and paternal grandfather; Hyperlipidemia in her father; Hypertension in her father.She reports that she has never smoked. She has never used smokeless tobacco. She reports that she drinks alcohol. She reports that she does not use drugs.  Outpatient Medications Prior to Visit  Medication Sig Dispense Refill  . azelastine (OPTIVAR) 0.05 % ophthalmic solution PLACE 1 DROP INTO BOTH EYES 2 (TWO) TIMES DAILY. 6 mL 12  . citalopram (CELEXA) 20 MG tablet TAKE 1/2 TABLET (10 MG TOTAL) BY MOUTH DAILY.  45 tablet 0  . fexofenadine (ALLEGRA) 180 MG tablet TAKE 1 TABLET BY MOUTH DAILY 30 tablet 11  . fluticasone (FLONASE) 50 MCG/ACT nasal spray Place 2 sprays into both nostrils daily. 16 g 11  . Multiple Vitamin (MULTIVITAMIN) capsule Take 1 capsule by mouth daily.    . Multiple Vitamins-Minerals  (HAIR VITAMINS PO) Take by mouth.    . TRI-PREVIFEM 0.18/0.215/0.25 MG-35 MCG tablet TAKE 1 TABLET BY MOUTH ONCE A DAY 84 tablet 1  . hydrOXYzine (ATARAX/VISTARIL) 25 MG tablet TAKE 1 TABLET (25 MG TOTAL) BY MOUTH 3 (THREE) TIMES DAILY AS NEEDED FOR ITCHING. 30 tablet 5  . triamcinolone cream (KENALOG) 0.1 % Apply 1 application topically 2 (two) times daily. (Patient not taking: Reported on 10/06/2017) 30 g 0  . Insulin Pen Needle (PEN NEEDLES) 31G X 6 MM MISC For use with victoza /saxenda (Patient not taking: Reported on 10/06/2017) 100 each 0  . Liraglutide -Weight Management (SAXENDA) 18 MG/3ML SOPN Inject 0.6 mg into the skin daily. Increase dose weekly as follows: Week 2: 1.2 mg daily ; Week 3: 1.8 mg daily; Week 4: 2.4 mg daily (Patient not taking: Reported on 10/06/2017) 9 mL 0  . Lorcaserin HCl 10 MG TABS Take 1 tablet by mouth 2 (two) times daily. (Patient not taking: Reported on 10/06/2017) 60 tablet 2  . Naltrexone-Bupropion HCl (CONTRAVE PO) Take by mouth.     No facility-administered medications prior to visit.     Review of Systems  Patient denies headache, fevers, malaise, unintentional weight loss, skin rash, eye pain, sinus congestion and sinus pain, sore throat, dysphagia,  hemoptysis , cough, dyspnea, wheezing, chest pain, palpitations, orthopnea, edema, abdominal pain, nausea, melena, diarrhea, constipation, flank pain, dysuria, hematuria, urinary  Frequency, nocturia, numbness, tingling, seizures,  Focal weakness, Loss of consciousness,  Tremor, insomnia, depression, anxiety, and suicidal ideation.     Objective:  BP 108/82 (BP Location: Left Arm, Patient Position: Sitting, Cuff Size: Normal)   Pulse 61   Temp 98.8 F (37.1 C) (Oral)   Resp 15   Ht 5\' 3"  (1.6 m)   Wt 145 lb 6.4 oz (66 kg)   SpO2 99%   BMI 25.76 kg/m   Physical Exam   General Appearance:    Alert, cooperative, no distress, appears stated age  Head:    Normocephalic, without obvious abnormality,  atraumatic  Eyes:    PERRL, conjunctiva/corneas clear, EOM's intact, fundi    benign, both eyes  Ears:    Normal TM's and external ear canals, both ears  Nose:   Nares normal, septum midline, mucosa normal, no drainage    or sinus tenderness  Throat:   Lips, mucosa, and tongue normal; teeth and gums normal  Neck:   Supple, symmetrical, trachea midline, no adenopathy;    thyroid:  no enlargement/tenderness/nodules; no carotid   bruit or JVD  Back:     Symmetric, no curvature, ROM normal, no CVA tenderness  Lungs:     Clear to auscultation bilaterally, respirations unlabored  Chest Wall:    No tenderness or deformity   Heart:    Regular rate and rhythm, S1 and S2 normal, no murmur, rub   or gallop  Breast Exam:    No tenderness, masses, or nipple abnormality  Abdomen:     Soft, non-tender, bowel sounds active all four quadrants,    no masses, no organomegaly  Genitalia:    Pelvic: cervix not visualized due to patient discomfort , external genitalia normal, no adnexal  masses or tenderness, no cervical motion tenderness, rectovaginal septum normal, uterus normal size, shape, and consistency and vagina normal without discharge  Extremities:   Extremities normal, atraumatic, no cyanosis or edema  Pulses:   2+ and symmetric all extremities  Skin:   Skin color, texture, turgor normal, no rashes or lesions  Lymph nodes:   Cervical, supraclavicular, and axillary nodes normal  Neurologic:   CNII-XII intact, normal strength, sensation and reflexes    throughout       Assessment & Plan:   Problem List Items Addressed This Visit    Cervical cancer screening - Primary    PAP smear was attempted today.  She has no known history of sexual activity .      Relevant Orders   Cytology - PAP   Eating disorder    No results with contrave.  Trial of saxenda recommended but not covered by insurance  Prior success with phentermine noted,  But risks of long term use reviewed.  Did well with   Liraglutide, will refill at once daily dosing rather than twice daily.   mother has resumed citalopram for management of anxiety .  Will need to watch closely for agitation and other signs of serotonin syndrome  And stop citalopram if problematic.  (buspirone and hydroxyzine would be alternative)      Excoriation, neurotic    Continue hydroxyzine prn         A total of 40 minutes was spent with patient more than half of which was spent in counseling patient on the above mentioned issues , reviewing and explaining recent labs and imaging studies done, and coordination of care.  I have discontinued Emilygrace Delellis's Naltrexone-Bupropion HCl (CONTRAVE PO), Liraglutide -Weight Management, and Pen Needles. I am also having her maintain her triamcinolone cream, fluticasone, multivitamin, Multiple Vitamins-Minerals (HAIR VITAMINS PO), fexofenadine, citalopram, TRI-PREVIFEM, azelastine, hydrOXYzine, and Lorcaserin HCl.  Meds ordered this encounter  Medications  . hydrOXYzine (ATARAX/VISTARIL) 25 MG tablet    Sig: Take 1 tablet (25 mg total) by mouth 3 (three) times daily as needed for itching.    Dispense:  90 tablet    Refill:  5  . DISCONTD: Lorcaserin HCl 10 MG TABS    Sig: Take 1 tablet by mouth 2 (two) times daily.    Dispense:  60 tablet    Refill:  2  . Lorcaserin HCl 10 MG TABS    Sig: Take 1 tablet by mouth 2 (two) times daily.    Dispense:  60 tablet    Refill:  2    Medications Discontinued During This Encounter  Medication Reason  . Insulin Pen Needle (PEN NEEDLES) 31G X 6 MM MISC Prescription never filled  . Liraglutide -Weight Management (SAXENDA) 18 MG/3ML SOPN Prescription never filled  . Naltrexone-Bupropion HCl (CONTRAVE PO) Prescription never filled  . hydrOXYzine (ATARAX/VISTARIL) 25 MG tablet Reorder  . Lorcaserin HCl 10 MG TABS Reorder  . Lorcaserin HCl 10 MG TABS Reorder    Follow-up: Return in about 6 months (around 04/06/2018).   Crecencio Mc, MD

## 2017-10-06 NOTE — Patient Instructions (Signed)
It was nice to see you today!  I recommend taking hydroxyzine every 8 hours as needed for itching  If it makes you too sleepy,  Take it only at night,  And take your Allegra in the morning and afternoon instead    We will try to get Belviq authorized again .  If your insurance refuses,  You may be able to pay for it out of pocket using [facility]

## 2017-10-08 NOTE — Assessment & Plan Note (Addendum)
No results with contrave.  Trial of saxenda recommended but not covered by insurance  Prior success with phentermine noted,  But risks of long term use reviewed.  Did well with  Liraglutide, will refill at once daily dosing rather than twice daily.   mother has resumed citalopram for management of anxiety .  Will need to watch closely for agitation and other signs of serotonin syndrome  And stop citalopram if problematic.  (buspirone and hydroxyzine would be alternative)

## 2017-10-08 NOTE — Assessment & Plan Note (Signed)
Continue hydroxyzine prn

## 2017-10-08 NOTE — Assessment & Plan Note (Signed)
PAP smear was attempted today.  She has no known history of sexual activity .

## 2017-10-10 LAB — CYTOLOGY - PAP
ADEQUACY: ABSENT
DIAGNOSIS: NEGATIVE
HPV (WINDOPATH): NOT DETECTED

## 2017-10-11 ENCOUNTER — Other Ambulatory Visit: Payer: Self-pay | Admitting: Internal Medicine

## 2017-10-20 LAB — LIPID PANEL
Cholesterol: 162 (ref 0–200)
HDL: 58 (ref 35–70)
LDL Cholesterol: 93
Triglycerides: 56 (ref 40–160)

## 2017-10-20 LAB — HEPATIC FUNCTION PANEL
ALT: 12 (ref 7–35)
AST: 152 — AB (ref 13–35)
Bilirubin, Total: 0.3

## 2017-10-20 LAB — BASIC METABOLIC PANEL
Creatinine: 0.6 (ref 0.5–1.1)
Glucose: 80
POTASSIUM: 5.5 — AB (ref 3.4–5.3)
SODIUM: 139 (ref 137–147)

## 2017-10-20 LAB — CBC AND DIFFERENTIAL
HCT: 39 (ref 36–46)
HEMOGLOBIN: 1.4 — AB (ref 12.0–16.0)
Neutrophils Absolute: 60
PLATELETS: 276 (ref 150–399)
WBC: 6.4

## 2017-10-20 LAB — IRON,TIBC AND FERRITIN PANEL: Iron: 124

## 2017-10-20 LAB — TSH: TSH: 1.9 (ref 0.41–5.90)

## 2017-10-23 ENCOUNTER — Telehealth: Payer: Self-pay | Admitting: Internal Medicine

## 2017-10-23 DIAGNOSIS — E875 Hyperkalemia: Secondary | ICD-10-CM

## 2017-10-23 NOTE — Telephone Encounter (Signed)
Fasting labs reviewed,  All normal except potassium was elevated,  Needs to repeat  A basic metabolic panel .  Order printed.Marland Kitchen

## 2017-10-24 NOTE — Telephone Encounter (Signed)
Spoke with pt's mother and she stated that that she would be by tomorrow to pick up the lab orders.

## 2017-10-24 NOTE — Telephone Encounter (Signed)
Pt mother calling back. Cb#[phone]

## 2017-10-24 NOTE — Telephone Encounter (Signed)
LMTCB. Please transfer to our office.  

## 2017-10-27 LAB — BASIC METABOLIC PANEL
BUN: 9 (ref 4–21)
Creatinine: 0.6 (ref 0.5–1.1)
Glucose: 81
POTASSIUM: 5 (ref 3.4–5.3)
SODIUM: 140 (ref 137–147)

## 2017-10-31 ENCOUNTER — Other Ambulatory Visit: Payer: Self-pay | Admitting: Internal Medicine

## 2017-11-05 ENCOUNTER — Telehealth: Payer: Self-pay | Admitting: Internal Medicine

## 2017-11-05 NOTE — Telephone Encounter (Signed)
Her  Labs were normal.  Please abstract   Regards,   Deborra Medina, MD

## 2017-11-06 NOTE — Telephone Encounter (Signed)
Pt's mother has been notified of lab results. Mother gave a verbal understanding. Labs have been abstracted.

## 2018-01-31 ENCOUNTER — Other Ambulatory Visit: Payer: Self-pay | Admitting: Internal Medicine

## 2018-02-15 ENCOUNTER — Other Ambulatory Visit: Payer: Self-pay | Admitting: Internal Medicine

## 2018-04-01 ENCOUNTER — Other Ambulatory Visit: Payer: Self-pay | Admitting: Internal Medicine

## 2018-04-06 ENCOUNTER — Ambulatory Visit: Payer: BLUE CROSS/BLUE SHIELD | Admitting: Internal Medicine

## 2018-04-20 ENCOUNTER — Ambulatory Visit: Payer: BLUE CROSS/BLUE SHIELD | Admitting: Internal Medicine

## 2018-05-21 ENCOUNTER — Other Ambulatory Visit: Payer: Self-pay | Admitting: Internal Medicine

## 2018-05-23 ENCOUNTER — Encounter: Payer: Self-pay | Admitting: Internal Medicine

## 2018-05-23 ENCOUNTER — Ambulatory Visit (INDEPENDENT_AMBULATORY_CARE_PROVIDER_SITE_OTHER): Payer: Medicare Other | Admitting: Internal Medicine

## 2018-05-23 ENCOUNTER — Other Ambulatory Visit: Payer: Self-pay

## 2018-05-23 DIAGNOSIS — L981 Factitial dermatitis: Secondary | ICD-10-CM | POA: Diagnosis not present

## 2018-05-23 DIAGNOSIS — F411 Generalized anxiety disorder: Secondary | ICD-10-CM | POA: Diagnosis not present

## 2018-05-23 DIAGNOSIS — F5089 Other specified eating disorder: Secondary | ICD-10-CM | POA: Diagnosis not present

## 2018-05-23 DIAGNOSIS — F09 Unspecified mental disorder due to known physiological condition: Secondary | ICD-10-CM

## 2018-05-23 MED ORDER — FLUTICASONE PROPIONATE 50 MCG/ACT NA SUSP: 2.0000 | Freq: Every day | NASAL | 11 refills | Status: DC

## 2018-05-23 MED ORDER — CITALOPRAM HYDROBROMIDE 10 MG PO TABS: 10.0000 mg | ORAL_TABLET | Freq: Every day | ORAL | 0 refills | Status: DC

## 2018-05-23 MED ORDER — FEXOFENADINE HCL 180 MG PO TABS: 180.0000 mg | ORAL_TABLET | Freq: Two times a day (BID) | ORAL | 11 refills | Status: DC | PRN

## 2018-05-23 MED ORDER — HYDROXYZINE HCL 25 MG PO TABS: 25.0000 mg | ORAL_TABLET | Freq: Three times a day (TID) | ORAL | 1 refills | Status: DC | PRN

## 2018-05-23 MED ORDER — NORGESTIM-ETH ESTRAD TRIPHASIC 0.18/0.215/0.25 MG-35 MCG PO TABS: 1.0000 | ORAL_TABLET | Freq: Every day | ORAL | 1 refills | Status: DC

## 2018-05-23 NOTE — Progress Notes (Signed)
Virtual Visit via [email or web address] Note  This visit type was conducted due to national recommendations for restrictions regarding the COVID-19 pandemic (e.g. social distancing).  This format is felt to be most appropriate for this patient at this time.  All issues noted in this document were discussed and addressed.  No physical exam was performed (except for noted visual exam findings with Video Visits).   I connected with@ on 05/23/18 at 12:30 PM EDT by a video enabled telemedicine application or telephone and verified that I am speaking with the correct person using two identifiers. Location patient: home Location provider: work or home office Persons participating in the virtual visit: patient, provider and mother Abigail Butts   I discussed the limitations, risks, security and privacy concerns of performing an evaluation and management service by telephone and the availability of in person appointments. I also discussed with the patient that there may be a patient responsible charge related to this service. The patient expressed understanding and agreed to proceed.  Reason for visit: medication refill and follow up on overweight. Cognitive disability  and GAD   HPI: 33 yr old female with history of anoxic brain injury at birth resulting in cognitive deficits  Who remains permanently disabled and  dependent on parents.    Her cognitive deficits result in lack of self control and she has an overeating disorder managed chronically with appetite suppressants, with weight gain recurring each time the medication is discontinued.    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   Her GAD is managed with citalopram and her chronic/neurotic itching with  hydroxyzine .  The patient has no signs or symptoms of COVID 19 infection (fever, cough, sore throat  or shortness of  breath beyond what is typical for patient).  Patient denies contact with other persons with the above mentioned symptoms or with anyone confirmed to have COVID 19   Her weight has been relatively stable during "shelter at home" due to restricted food choices.  She is currently Not taking any appetite suppressants due to the out of pocket cost of the recommended alternative to phentermine (Victoza)   Her mother is concerned that she is spending too much time on social media  , You tube etc. She apparently spends hours and hours daily and is no longer spending time with the family watching TV.   Addressed today.. mother works full time so there is no paternal governance or supervision during the day .  Addressed need to limit access to one hour daily  Seasonal rhinitis and chronic urticaria.  Advised her to increase allegra to bid and use hydroxyzine every 8 hours prn.  All meds refilled.    ROS: See pertinent positives and negatives per HPI.  Past Medical History:  Diagnosis Date  . Allergy   . Depression   . Developmental delay, moderate     No past surgical history on file.  Family History  Problem Relation Age of Onset  . Hypertension Father   . Hyperlipidemia Father   . Cancer Maternal Grandmother   . Cancer Maternal Grandfather   . Cancer Paternal Grandfather     SOCIAL HX: intellectually impaired,  Lives with mother/father    Current Outpatient Medications:  .  azelastine (OPTIVAR) 0.05 % ophthalmic solution, PLACE 1 DROP INTO BOTH EYES 2 (TWO) TIMES  DAILY., Disp: 6 mL, Rfl: 12 .  citalopram (CELEXA) 10 MG tablet, Take 1 tablet (10 mg total) by mouth daily., Disp: 30 tablet, Rfl: 0 .  fexofenadine (ALLEGRA) 180 MG tablet, Take 1 tablet (180 mg total) by mouth 2 (two) times daily as needed for allergies or rhinitis., Disp: 60 tablet, Rfl: 11 .  fluticasone (FLONASE) 50 MCG/ACT nasal spray, Place 2 sprays into both nostrils daily., Disp: 16 g, Rfl: 11 .  hydrOXYzine  (ATARAX/VISTARIL) 25 MG tablet, Take 1 tablet (25 mg total) by mouth 3 (three) times daily as needed for itching., Disp: 270 tablet, Rfl: 1 .  Multiple Vitamin (MULTIVITAMIN) capsule, Take 1 capsule by mouth daily., Disp: , Rfl:  .  Multiple Vitamins-Minerals (HAIR VITAMINS PO), Take by mouth., Disp: , Rfl:  .  Norgestimate-Ethinyl Estradiol Triphasic (TRI-PREVIFEM) 0.18/0.215/0.25 MG-35 MCG tablet, Take 1 tablet by mouth daily., Disp: 84 tablet, Rfl: 1  EXAM:  VITALS per patient if applicable:  GENERAL: alert, oriented, appears well and in no acute distress  HEENT: atraumatic, conjunttiva clear, no obvious abnormalities on inspection of external nose and ears  NECK: normal movements of the head and neck  LUNGS: on inspection no signs of respiratory distress, breathing rate appears normal, no obvious gross SOB, gasping or wheezing  CV: no obvious cyanosis  MS: moves all visible extremities without noticeable abnormality  PSYCH/NEURO: pleasant and cooperative, mild  Anxiety noted. , speech and thought processing are limited to simple answers of yes and no. She lacks the capacity to reason and make judgements.  ASSESSMENT AND PLAN:  Discussed the following assessment and plan:  Excoriation, neurotic  Mild perinatal anoxic-ischemic brain injury  Generalized anxiety disorder  Other disorder of eating  Acquired cognitive dysfunction  Excoriation, neurotic Continue hydroxyzine prn and add allegra bid.   Mild perinatal anoxic-ischemic brain injury Reportedly occurring at birth.  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   Generalized anxiety disorder Improved with celexa , no changes today   Eating disorder I have recommend closer parental supervision and continued restriction of foods rather than resuming phentermine   Acquired  cognitive dysfunction Likely occurred at birth.  Her lack of competency and judgement impairs her ability to maintain a healthyweight,  Live independently,  Or work independently on any taks other than menial duties .  Disability form completed and signed     I discussed the assessment and treatment plan with the patient. The patient was provided an opportunity to ask questions and all were answered. The patient agreed with the plan and demonstrated an understanding of the instructions.   The patient was advised to call back or seek an in-person evaluation if the symptoms worsen or if the condition fails to improve as anticipated.  I provided 25 minutes of non-face-to-face time during this encounter.   Crecencio Mc, MD

## 2018-05-24 NOTE — Assessment & Plan Note (Signed)
I have recommend closer parental supervision and continued restriction of foods rather than resuming phentermine

## 2018-05-24 NOTE — Assessment & Plan Note (Signed)
Continue hydroxyzine prn and add allegra bid.

## 2018-05-24 NOTE — Assessment & Plan Note (Signed)
Improved with celexa , no changes today

## 2018-05-24 NOTE — Assessment & Plan Note (Signed)
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  Her lack of competency and judgement impairs her ability to maintain a healthyweight,  Live independently,  Or work independently on any taks other than menial duties .  Disability form completed and signed

## 2018-05-24 NOTE — Assessment & Plan Note (Addendum)
Reportedly occurring at birth.  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

## 2018-07-06 ENCOUNTER — Telehealth: Payer: Self-pay | Admitting: Internal Medicine

## 2018-07-06 NOTE — Telephone Encounter (Signed)
LMTCB. Please transfer Ashlee Graves to our office.

## 2018-07-06 NOTE — Telephone Encounter (Signed)
Copied from [place] [id number]. Topic: General - Other >> Jul 06, 2018 12:43 PM Lennox Solders wrote: Reason for CRM:pt mother abbigale mcelhaney is calling and she is still waiting on disability form to be mail to her or she will pick up. Pt had virtual appt  05-23-2018

## 2018-07-06 NOTE — Telephone Encounter (Signed)
Pt mom called back returning your call. Thank you!

## 2018-07-15 ENCOUNTER — Other Ambulatory Visit: Payer: Self-pay | Admitting: Internal Medicine

## 2018-07-31 NOTE — Telephone Encounter (Signed)
LMTCB

## 2018-08-02 NOTE — Telephone Encounter (Signed)
Spoke with pt's mother to let her know that the forms had been mailed to her about a month or so ago. Mother stated that she did not receive them but that she was going to try to find a copy of them and send them back to [place] to fill out again. Mother stated that she would call me back when she found the copy.

## 2018-09-25 DIAGNOSIS — Z1283 Encounter for screening for malignant neoplasm of skin: Secondary | ICD-10-CM | POA: Diagnosis not present

## 2018-09-25 DIAGNOSIS — D2261 Melanocytic nevi of right upper limb, including shoulder: Secondary | ICD-10-CM | POA: Diagnosis not present

## 2018-09-25 DIAGNOSIS — L814 Other melanin hyperpigmentation: Secondary | ICD-10-CM | POA: Diagnosis not present

## 2018-09-25 DIAGNOSIS — D2262 Melanocytic nevi of left upper limb, including shoulder: Secondary | ICD-10-CM | POA: Diagnosis not present

## 2019-01-08 ENCOUNTER — Other Ambulatory Visit: Payer: Self-pay | Admitting: Internal Medicine

## 2019-01-12 ENCOUNTER — Other Ambulatory Visit: Payer: Self-pay | Admitting: Internal Medicine

## 2019-02-27 ENCOUNTER — Other Ambulatory Visit: Payer: Self-pay | Admitting: Internal Medicine

## 2019-02-27 NOTE — Telephone Encounter (Signed)
Refilled: 01/31/2018 Last OV: 05/23/2018 Next OV: not scheduled

## 2019-04-30 DIAGNOSIS — Z0189 Encounter for other specified special examinations: Secondary | ICD-10-CM | POA: Diagnosis not present

## 2019-05-11 ENCOUNTER — Ambulatory Visit: Payer: Medicare Other | Attending: Internal Medicine

## 2019-05-11 DIAGNOSIS — Z23 Encounter for immunization: Secondary | ICD-10-CM

## 2019-05-11 NOTE — Progress Notes (Signed)
   Covid-19 Vaccination [facility]  Name:  Harli Magwood    MRN: [id number] DOB: [birth year]  05/11/2019  Ms. Buckles was observed post Covid-19 immunization for 15 minutes without incident. She was provided with Vaccine Information Sheet and instruction to access the V-Safe system.   Ms. Olive was instructed to call 911 with any severe reactions post vaccine: Marland Kitchen Difficulty breathing  . Swelling of face and throat  . A fast heartbeat  . A bad rash all over body  . Dizziness and weakness   Immunizations Administered    Name Date Dose VIS Date Route   Pfizer COVID-19 Vaccine 05/11/2019  9:27 AM 0.3 mL 01/04/2019 Intramuscular   Manufacturer: [place]   Lot: [id number]   [place]: [id number]

## 2019-05-17 ENCOUNTER — Other Ambulatory Visit: Payer: Self-pay | Admitting: Internal Medicine

## 2019-05-20 LAB — LIPID PANEL
Cholesterol: 189 (ref 0–200)
HDL: 48 (ref 35–70)
LDL Cholesterol: 127
Triglycerides: 78 (ref 40–160)

## 2019-05-20 LAB — CBC AND DIFFERENTIAL
HCT: 45 (ref 36–46)
Hemoglobin: 14.8 (ref 12.0–16.0)
Neutrophils Absolute: 5
Platelets: 325 (ref 150–399)
WBC: 8.1

## 2019-05-20 LAB — BASIC METABOLIC PANEL
BUN: 6 (ref 4–21)
Chloride: 101 (ref 99–108)
Creatinine: 0.7 (ref 0.5–1.1)
Glucose: 93
Potassium: 4.3 (ref 3.4–5.3)
Sodium: 138 (ref 137–147)

## 2019-05-20 LAB — VITAMIN D 25 HYDROXY (VIT D DEFICIENCY, FRACTURES): Vit D, 25-Hydroxy: 22.6

## 2019-05-20 LAB — COMPREHENSIVE METABOLIC PANEL
Albumin: 4.2 (ref 3.5–5.0)
Calcium: 9 (ref 8.7–10.7)
GFR calc Af Amer: 130
GFR calc non Af Amer: 112
Globulin: 3.3

## 2019-05-20 LAB — CBC: RBC: 4.98 (ref 3.87–5.11)

## 2019-05-20 LAB — HEPATIC FUNCTION PANEL
ALT: 11 (ref 7–35)
AST: 23 (ref 13–35)
Alkaline Phosphatase: 62 (ref 25–125)
Bilirubin, Total: 0.4

## 2019-05-20 LAB — IRON,TIBC AND FERRITIN PANEL: Iron: 182

## 2019-05-20 LAB — TSH: TSH: 2.49 (ref 0.41–5.90)

## 2019-05-21 LAB — HEMOGLOBIN A1C: Hemoglobin A1C: 5.2

## 2019-05-30 DIAGNOSIS — R7989 Other specified abnormal findings of blood chemistry: Secondary | ICD-10-CM | POA: Diagnosis not present

## 2019-05-30 DIAGNOSIS — Z043 Encounter for examination and observation following other accident: Secondary | ICD-10-CM | POA: Diagnosis not present

## 2019-05-30 DIAGNOSIS — Z713 Dietary counseling and surveillance: Secondary | ICD-10-CM | POA: Diagnosis not present

## 2019-06-01 ENCOUNTER — Ambulatory Visit: Payer: Medicare Other | Attending: Internal Medicine

## 2019-06-01 DIAGNOSIS — Z23 Encounter for immunization: Secondary | ICD-10-CM

## 2019-06-01 NOTE — Progress Notes (Signed)
   Covid-19 Vaccination Clinic  Name:  Ashlee Graves    MRN: [id number] DOB: 1985-06-02  06/01/2019  [name] was observed post Covid-19 immunization for 15 minutes without incident. She was provided with Vaccine Information Sheet and instruction to access the V-Safe system.   [name] was instructed to call 911 with any severe reactions post vaccine: Marland Kitchen Difficulty breathing  . Swelling of face and throat  . A fast heartbeat  . A bad rash all over body  . Dizziness and weakness   Immunizations Administered    Name Date Dose VIS Date Route   Pfizer COVID-19 Vaccine 06/01/2019  9:19 AM 0.3 mL 03/20/2018 Intramuscular   Manufacturer: Marydel   Lot: [id number]   [place]: [id number]

## 2019-06-05 ENCOUNTER — Telehealth: Payer: Self-pay | Admitting: Internal Medicine

## 2019-06-05 MED ORDER — ERGOCALCIFEROL 1.25 MG (50000 UT) PO CAPS: 50000.0000 [IU] | ORAL_CAPSULE | ORAL | 3 refills | Status: DC

## 2019-06-05 NOTE — Telephone Encounter (Signed)
LMTCB

## 2019-06-05 NOTE — Telephone Encounter (Signed)
April 26 labs reviewed .. nothing urgent,  Will review at next office visit,  But:    Your vitamin D is low again.   I am calling in a megadose of Vit D to take once weekly for a total of 3 months.  After you finish the weekly Vitamin D supplement, she should start taking an OTC  Vit D3 supplement 1000 units daily, and increase the dose to 2000 Ius daily from November through April. Marland Kitchen

## 2019-06-09 ENCOUNTER — Other Ambulatory Visit: Payer: Self-pay | Admitting: Internal Medicine

## 2019-06-10 ENCOUNTER — Other Ambulatory Visit: Payer: Self-pay

## 2019-06-10 DIAGNOSIS — Z79899 Other long term (current) drug therapy: Secondary | ICD-10-CM

## 2019-06-10 MED ORDER — NORGESTIM-ETH ESTRAD TRIPHASIC 0.18/0.215/0.25 MG-35 MCG PO TABS: 1.0000 | ORAL_TABLET | Freq: Every day | ORAL | 3 refills | Status: DC

## 2019-06-10 NOTE — Telephone Encounter (Signed)
Patient needs to have CMET done ASAP!!  HER MOTHER HAS THEM DONE ELSEWHERE BUT HAS NOT HAD ANY IN  2 YEARS   REFILL SENT

## 2019-06-10 NOTE — Telephone Encounter (Signed)
Refill request for tri-estarylla, last seen 05-24-18.  Please advise.

## 2019-06-10 NOTE — Addendum Note (Signed)
Addended by: Crecencio Mc on: 06/10/2019 01:23 PM   Modules accepted: Orders

## 2019-06-21 ENCOUNTER — Other Ambulatory Visit: Payer: Self-pay

## 2019-06-25 ENCOUNTER — Encounter: Payer: Medicare Other | Admitting: Internal Medicine

## 2019-07-05 ENCOUNTER — Other Ambulatory Visit: Payer: Self-pay

## 2019-07-09 ENCOUNTER — Encounter: Payer: Self-pay | Admitting: Internal Medicine

## 2019-07-09 ENCOUNTER — Ambulatory Visit (INDEPENDENT_AMBULATORY_CARE_PROVIDER_SITE_OTHER): Payer: BC Managed Care – PPO | Admitting: Internal Medicine

## 2019-07-09 ENCOUNTER — Other Ambulatory Visit: Payer: Self-pay

## 2019-07-09 VITALS — BP 120/88 | HR 68 | Temp 97.5°F | Resp 16 | Ht 63.0 in | Wt 189.2 lb

## 2019-07-09 DIAGNOSIS — R790 Abnormal level of blood mineral: Secondary | ICD-10-CM | POA: Diagnosis not present

## 2019-07-09 DIAGNOSIS — Z79899 Other long term (current) drug therapy: Secondary | ICD-10-CM

## 2019-07-09 DIAGNOSIS — R7301 Impaired fasting glucose: Secondary | ICD-10-CM | POA: Diagnosis not present

## 2019-07-09 DIAGNOSIS — Z124 Encounter for screening for malignant neoplasm of cervix: Secondary | ICD-10-CM | POA: Diagnosis not present

## 2019-07-09 DIAGNOSIS — F5089 Other specified eating disorder: Secondary | ICD-10-CM | POA: Diagnosis not present

## 2019-07-09 DIAGNOSIS — R635 Abnormal weight gain: Secondary | ICD-10-CM

## 2019-07-09 DIAGNOSIS — F09 Unspecified mental disorder due to known physiological condition: Secondary | ICD-10-CM

## 2019-07-09 DIAGNOSIS — E663 Overweight: Secondary | ICD-10-CM

## 2019-07-09 NOTE — Assessment & Plan Note (Signed)
Because of her learning disability she is unable to control her eating and has gained 40 lbs in the last year.

## 2019-07-09 NOTE — Progress Notes (Signed)
Patient ID: Ashlee Graves, female    DOB: 1985/03/18  Age: 34 y.o. MRN: [id number]  The patient is here for  FOLLOW UP AND  and management of other chronic and acute problems.   The risk factors are reflected in the social history. She is accompanied by her mother , her general guardian  The roster of all physicians providing medical care to patient - is listed in the Snapshot section of the chart.  Activities of daily living:  The patient is 100% independent in all ADLs: dressing, toileting, feeding as well as independent mobility  Home safety : The patient has smoke detectors in the home. They wear seatbelts.  There are no firearms at home. There is no violence in the home.   There is no risks for hepatitis, STDs or HIV. There is no   history of blood transfusion. They have no travel history to infectious disease endemic areas of the world.  The patient has seen their dentist in the last six month. They have seen their eye doctor in the last year. They admit to slight hearing difficulty with regard to whispered voices and some television programs.  They have deferred audiologic testing in the last year.  They do not  have excessive sun exposure. Discussed the need for sun protection: hats, long sleeves and use of sunscreen if there is significant sun exposure.   Diet: the importance of a healthy diet is discussed. She overeats and lacks the intellectual capacity to follow a healthy diet . She has gained 40 lbs since her last visit .  The benefits of regular aerobic exercise were discussed. She walks 4 times per week ,  20 minutes.   Depression screen: there are no signs or vegative symptoms of untreateddepression- irritability, change in appetite, anhedonia, sadness/tearfullnes  Cognitive assessment: the patient is unable to manage  any of her  financial and personal affairs and is actively engaged. They could relate day,date,year and events; recalled 1/3 objects at 3 minutes; performed  clock-face test normally.  The following portions of the patient's history were reviewed and updated as appropriate: allergies, current medications, past family history, past medical history,  past surgical history, past social history  and problem list.  Visual acuity was not assessed per patient preference since she has regular follow up with her ophthalmologist. Hearing and body mass index were assessed and reviewed.   During the course of the visit the patient was educated and counseled about appropriate screening and preventive services including : fall prevention , diabetes screening, nutrition counseling, colorectal cancer screening, and recommended immunizations.    CC: The primary encounter diagnosis was Abnormal serum iron level. Diagnoses of Weight gain, Impaired fasting glucose, Long-term current use of high risk medication other than anticoagulant, Cervical cancer screening, Other disorder of eating, Overweight (BMI 25.0-29.9), and Acquired cognitive dysfunction were also pertinent to this visit.  Obesity:  Overeating.  Family not supervising. Mother works until late at night.  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  Spends 4 days week at [employer] in classes from 8 to 3  Cyclic episodes of vomiting without nausea  occurring once a month and lasting a day before resolving. Not related to constipation, which is managed with stool softener docusate. Patient unable to provide good history.   HAVING MENSES THAT LAST A WEEK.  USING BIRTH CONTROL.   MOTHER NOW HAVING TO MANAGE HER MEDICATIONS. Memory getting worse. Reviewed history. Breech,  c section . Mother not sure what caused her intellectual  disability,  Told that Part of her brain did not develop." last neurology eval was decades ago at [facility]. Went to developmental center at [place], isolated for kindergarten through elementary school.    Iron was elevated in April hgb normal.        History Ashlee Graves has a past  medical history of Allergy, Depression, and Developmental delay, moderate.   She has no past surgical history on file.   Her family history includes Arthritis/Rheumatoid in her mother; Cancer in her maternal grandfather, maternal grandmother, and paternal grandfather; Hyperlipidemia in her father; Hypertension in her father.She reports that she has never smoked. She has never used smokeless tobacco. She reports current alcohol use. She reports that she does not use drugs.  Outpatient Medications Prior to Visit  Medication Sig Dispense Refill  . azelastine (OPTIVAR) 0.05 % ophthalmic solution INSTILL 1 DROP INTO BOTH EYES TWICE A DAY 18 mL 4  . citalopram (CELEXA) 10 MG tablet TAKE 1 TABLET BY MOUTH EVERY DAY 90 tablet 1  . ergocalciferol (DRISDOL) 1.25 MG (50000 UT) capsule Take 1 capsule (50,000 Units total) by mouth once a week. 4 capsule 3  . fexofenadine (ALLEGRA) 180 MG tablet Take 1 tablet (180 mg total) by mouth 2 (two) times daily as needed for allergies or rhinitis. 60 tablet 11  . fluticasone (FLONASE) 50 MCG/ACT nasal spray SPRAY 2 SPRAYS INTO EACH NOSTRIL EVERY DAY 48 mL 3  . hydrOXYzine (ATARAX/VISTARIL) 25 MG tablet TAKE 1 TABLET (25 MG TOTAL) BY MOUTH 3 (THREE) TIMES DAILY AS NEEDED FOR ITCHING. 270 tablet 1  . Multiple Vitamin (MULTIVITAMIN) capsule Take 1 capsule by mouth daily.    . Multiple Vitamins-Minerals (HAIR VITAMINS PO) Take by mouth.    . Norgestimate-Ethinyl Estradiol Triphasic (TRI-ESTARYLLA) 0.18/0.215/0.25 MG-35 MCG tablet Take 1 tablet by mouth daily. 84 tablet 3   No facility-administered medications prior to visit.    Review of Systems   Patient denies headache, fevers, malaise, unintentional weight loss, skin rash, eye pain, sinus congestion and sinus pain, sore throat, dysphagia,  hemoptysis , cough, dyspnea, wheezing, chest pain, palpitations, orthopnea, edema, abdominal pain, nausea, melena, diarrhea, constipation, flank pain, dysuria, hematuria, urinary   Frequency, nocturia, numbness, tingling, seizures,  Focal weakness, Loss of consciousness,  Tremor, insomnia, depression, anxiety, and suicidal ideation.     Objective:  BP 120/88 (BP Location: Left Arm, Patient Position: Sitting, Cuff Size: Normal)   Pulse 68   Temp (!) 97.5 F (36.4 C) (Temporal)   Resp 16   Ht 5\' 3"  (1.6 m)   Wt 189 lb 3.2 oz (85.8 kg)   SpO2 96%   BMI 33.52 kg/m   Physical Exam   General appearance: alert, cooperative and appears stated age Ears: normal TM's and external ear canals both ears Throat: lips, mucosa, and tongue normal; teeth and gums normal Neck: no adenopathy, no carotid bruit, supple, symmetrical, trachea midline and thyroid not enlarged, symmetric, no tenderness/mass/nodules Back: symmetric, no curvature. ROM normal. No CVA tenderness. Lungs: clear to auscultation bilaterally Heart: regular rate and rhythm, S1, S2 normal, no murmur, click, rub or gallop Abdomen: soft, non-tender; bowel sounds normal; no masses,  no organomegaly Pulses: 2+ and symmetric Skin: Skin color, texture, turgor normal. No rashes or lesions Lymph nodes: Cervical, supraclavicular, and axillary nodes normal.   Assessment & Plan:   Problem List Items Addressed This Visit      Unprioritized   Abnormal serum iron level - Primary  Noted on recent comprehensive screening panel donein April.  There is no known family history of HH .  Additional iron studies ordered and repeat CBC      Relevant Orders   Iron, TIBC and Ferritin Panel   Acquired cognitive dysfunction    She has not had neurologic follow up since adolescence base on mother's preference .  She was educated in the public school system in the Special Ed classes and finished high school.  She continues to attend classes for special disability at [employer]. Her mother notes that she has been more forgetful about her medications, requiring parental supervision,  But remains able ro use her cell phone.  Neurology  referral offered but declined.         Cervical cancer screening    She is not sexually active and does not require cervical ca screening       Eating disorder    Because of her learning disability she is unable to control her eating and has gained 40 lbs in the last year.      Long-term current use of high risk medication other than anticoagulant    She has been unable to maintain a healthy weight without use of phentermine. Referral to cardiology for assessment of cardiac health  Prior to resuming phentermine.       Relevant Orders   Ambulatory referral to Cardiology   Overweight (BMI 25.0-29.9)    Reviewed diet with mother.  Patient  Has limited supervision by family with regard to food intake  and does not exercise. Thyroid function was normal in April by outside labs.  lipds were normal as well.  a1c ordered.        Other Visit Diagnoses    Weight gain       Impaired fasting glucose       Relevant Orders   Hemoglobin A1c    A total of 40 minutes was spent with patient more than half of which was spent in counseling patient on the above mentioned issues , reviewing and explaining recent labs and imaging studies done, and coordination of care.  I am having Illene Labrador maintain her multivitamin, Multiple Vitamins-Minerals (HAIR VITAMINS PO), fexofenadine, azelastine, fluticasone, ergocalciferol, citalopram, hydrOXYzine, and Norgestimate-Ethinyl Estradiol Triphasic.  No orders of the defined types were placed in this encounter.   There are no discontinued medications.  Follow-up: Return in about 2 months (around 09/08/2019).   Crecencio Mc, MD

## 2019-07-09 NOTE — Assessment & Plan Note (Signed)
Noted on recent comprehensive screening panel donein April.  There is no known family history of HH .  Additional iron studies ordered and repeat CBC

## 2019-07-09 NOTE — Assessment & Plan Note (Signed)
She is not sexually active and does not require cervical ca screening

## 2019-07-09 NOTE — Assessment & Plan Note (Signed)
She has not had neurologic follow up since adolescence base on mother's preference .  She was educated in the public school system in the Special Ed classes and finished high school.  She continues to attend classes for special disability at [employer]. Her mother notes that she has been more forgetful about her medications, requiring parental supervision,  But remains able ro use her cell phone.  Neurology referral offered but declined.

## 2019-07-09 NOTE — Patient Instructions (Signed)
REferral to cardiology for evaluation of heart prior to resuming phentermine   Return after cardiology evaluation for discussion, breast exam

## 2019-07-09 NOTE — Assessment & Plan Note (Signed)
She has been unable to maintain a healthy weight without use of phentermine. Referral to cardiology for assessment of cardiac health  Prior to resuming phentermine.

## 2019-07-09 NOTE — Assessment & Plan Note (Signed)
Reviewed diet with mother.  Patient  Has limited supervision by family with regard to food intake  and does not exercise. Thyroid function was normal in April by outside labs.  lipds were normal as well.  a1c ordered.

## 2019-07-10 DIAGNOSIS — Z0189 Encounter for other specified special examinations: Secondary | ICD-10-CM | POA: Diagnosis not present

## 2019-07-15 LAB — IRON,TIBC AND FERRITIN PANEL
%SAT: 45
Ferritin: 31
Iron: 138
TIBC: 305
UIBC: 167

## 2019-07-21 ENCOUNTER — Encounter: Payer: Self-pay | Admitting: Internal Medicine

## 2019-07-21 DIAGNOSIS — E663 Overweight: Secondary | ICD-10-CM

## 2019-07-21 DIAGNOSIS — R790 Abnormal level of blood mineral: Secondary | ICD-10-CM

## 2019-07-21 NOTE — Assessment & Plan Note (Signed)
Repeat iron levels are normal

## 2019-07-21 NOTE — Assessment & Plan Note (Signed)
Lab Results  Component Value Date   HGBA1C 5.2 05/21/2019   There is no evidence of at risk for diabetes

## 2019-08-05 ENCOUNTER — Encounter: Payer: Self-pay | Admitting: Cardiology

## 2019-08-05 ENCOUNTER — Other Ambulatory Visit: Payer: Self-pay

## 2019-08-05 ENCOUNTER — Ambulatory Visit (INDEPENDENT_AMBULATORY_CARE_PROVIDER_SITE_OTHER): Payer: BC Managed Care – PPO | Admitting: Cardiology

## 2019-08-05 VITALS — BP 138/108 | HR 72 | Ht 63.0 in | Wt 186.0 lb

## 2019-08-05 DIAGNOSIS — Z7689 Persons encountering health services in other specified circumstances: Secondary | ICD-10-CM

## 2019-08-05 DIAGNOSIS — R030 Elevated blood-pressure reading, without diagnosis of hypertension: Secondary | ICD-10-CM | POA: Diagnosis not present

## 2019-08-05 NOTE — Patient Instructions (Signed)
Medication Instructions:  None Ordered *If you need a refill on your cardiac medications before your next appointment, please call your pharmacy*   Lab Work: None Ordered If you have labs (blood work) drawn today and your tests are completely normal, you will receive your results only by: Marland Kitchen MyChart Message (if you have MyChart) OR . A paper copy in the mail If you have any lab test that is abnormal or we need to change your treatment, we will call you to review the results.   Testing/Procedures: None Ordered   Follow-Up: At [facility], you and your health needs are our priority.  As part of our continuing mission to provide you with exceptional heart care, we have created designated Provider Care Teams.  These Care Teams include your primary Cardiologist (physician) and Advanced Practice Providers (APPs -  Physician Assistants and Nurse Practitioners) who all work together to provide you with the care you need, when you need it.  We recommend signing up for the patient portal called "MyChart".  Sign up information is provided on this After Visit Summary.  MyChart is used to connect with patients for Virtual Visits (Telemedicine).  Patients are able to view lab/test results, encounter notes, upcoming appointments, etc.  Non-urgent messages can be sent to your provider as well.   To learn more about what you can do with MyChart, go to [email or web address].    Your next appointment:   2 week(s)  The format for your next appointment:   In Person  Provider:   Kate Sable, MD   Other Instructions N/A

## 2019-08-05 NOTE — Progress Notes (Signed)
Cardiology Office Note:    Date:  08/05/2019   ID:  Ashlee Graves, DOB [birth year], MRN [id number]  PCP:  Crecencio Mc, MD  [facility] Cardiologist:  Kate Sable, MD  [facility] Electrophysiologist:  None   Referring MD: Crecencio Mc, MD   Chief Complaint  Patient presents with  . OTHER    Long term use of high risk meds no complaints today. Meds reviewed verbally with pt.    History of Present Illness:    Ashlee Graves is a 34 y.o. female with a hx of developmental delay, eating disorder who is being seen due to cardiac eval prior to potential use of phentermine.  Patient usually works at [employer].  Due to the current pandemic, the gym was closed and she has been at home without much of physical activity.  She has subsequently gained weight due to this.  Phentermine is being considered to help patient with weight loss.  She denies any history of heart disease, palpitations or hypertension.  Denies any symptoms of chest pain or shortness of breath.  Past Medical History:  Diagnosis Date  . Allergy   . Depression   . Developmental delay, moderate     History reviewed. No pertinent surgical history.  Current Medications: Current Meds  Medication Sig  . azelastine (OPTIVAR) 0.05 % ophthalmic solution INSTILL 1 DROP INTO BOTH EYES TWICE A DAY  . citalopram (CELEXA) 10 MG tablet TAKE 1 TABLET BY MOUTH EVERY DAY  . ergocalciferol (DRISDOL) 1.25 MG (50000 UT) capsule Take 1 capsule (50,000 Units total) by mouth once a week.  . fexofenadine (ALLEGRA) 180 MG tablet Take 1 tablet (180 mg total) by mouth 2 (two) times daily as needed for allergies or rhinitis.  . fluticasone (FLONASE) 50 MCG/ACT nasal spray SPRAY 2 SPRAYS INTO EACH NOSTRIL EVERY DAY  . hydrOXYzine (ATARAX/VISTARIL) 25 MG tablet TAKE 1 TABLET (25 MG TOTAL) BY MOUTH 3 (THREE) TIMES DAILY AS NEEDED FOR ITCHING.  . Multiple Vitamin (MULTIVITAMIN) capsule Take 1 capsule by mouth daily.  . Multiple  Vitamins-Minerals (HAIR VITAMINS PO) Take by mouth.  . Norgestimate-Ethinyl Estradiol Triphasic (TRI-ESTARYLLA) 0.18/0.215/0.25 MG-35 MCG tablet Take 1 tablet by mouth daily.     Allergies:   Patient has no known allergies.   Social History   Socioeconomic History  . Marital status: Single    Spouse name: Not on file  . Number of children: Not on file  . Years of education: Not on file  . Highest education level: Not on file  Occupational History  . Not on file  Tobacco Use  . Smoking status: Never Smoker  . Smokeless tobacco: Never Used  Substance and Sexual Activity  . Alcohol use: Yes    Comment: occasionally  . Drug use: No  . Sexual activity: Not on file  Other Topics Concern  . Not on file  Social History Narrative  . Not on file   Social Determinants of Health   Financial Resource Strain:   . Difficulty of Paying Living Expenses:   Food Insecurity:   . Worried About Charity fundraiser in the Last Year:   . Arboriculturist in the Last Year:   Transportation Needs:   . Film/video editor (Medical):   Marland Kitchen Lack of Transportation (Non-Medical):   Physical Activity:   . Days of Exercise per Week:   . Minutes of Exercise per Session:   Stress:   . Feeling of Stress :  Social Connections:   . Frequency of Communication with Friends and Family:   . Frequency of Social Gatherings with Friends and Family:   . Attends Religious Services:   . Active Member of Clubs or Organizations:   . Attends Archivist Meetings:   Marland Kitchen Marital Status:      Family History: The patient's family history includes Arthritis/Rheumatoid in her mother; Cancer in her maternal grandfather, maternal grandmother, and paternal grandfather; Hyperlipidemia in her father; Hypertension in her father.  ROS:   Please see the history of present illness.     All other systems reviewed and are negative.  EKGs/Labs/Other Studies Reviewed:    The following studies were reviewed  today:   EKG:  EKG is  ordered today.  The ekg ordered today demonstrates normal sinus rhythm, normal ECG.  Recent Labs: 05/20/2019: ALT 11; BUN 6; Creatinine 0.7; Hemoglobin 14.8; Platelets 325; Potassium 4.3; Sodium 138; TSH 2.49  Recent Lipid Panel    Component Value Date/Time   CHOL 189 05/20/2019 0000   TRIG 78 05/20/2019 0000   HDL 48 05/20/2019 0000   LDLCALC 127 05/20/2019 0000    Physical Exam:    VS:  BP (!) 138/108 (BP Location: Right Arm, Patient Position: Sitting, Cuff Size: Normal)   Pulse 72   Ht 5\' 3"  (1.6 m)   Wt 186 lb (84.4 kg)   SpO2 96%   BMI 32.95 kg/m     Wt Readings from Last 3 Encounters:  08/05/19 186 lb (84.4 kg)  07/09/19 189 lb 3.2 oz (85.8 kg)  05/23/18 149 lb (67.6 kg)     GEN:  Well nourished, well developed in no acute distress HEENT: Normal NECK: No JVD; No carotid bruits LYMPHATICS: No lymphadenopathy CARDIAC: RRR, no murmurs, rubs, gallops RESPIRATORY:  Clear to auscultation without rales, wheezing or rhonchi  ABDOMEN: Soft, non-tender, non-distended MUSCULOSKELETAL:  No edema; No deformity  SKIN: Warm and dry NEUROLOGIC:  Alert and oriented x 3 PSYCHIATRIC:  Normal affect   ASSESSMENT:    1. Encounter for new medication prescription   2. Elevated BP without diagnosis of hypertension    PLAN:    In order of problems listed above:  1. Patient with history of weight gain, phentermine being planned for weight loss.  Typical side effects of this medication include hypertension, arrhythmias due to medication being a CNS stimulant.  Usual contraindications are history of heart failure, CAD, significant arrhythmias, patient does not have any of these either.  She is young, no cardiac risk factors.  From a cardiac perspective, medication can be started without any additional testing.  Blood pressure, heart rate and symptoms need to be monitored while on this medication. 2. Her blood pressure today is elevated.  Previous readings in the  chart have been normal.  Frequent BP checks at home encouraged.  Patient will follow-up in 2 weeks for repeat blood pressure.  If still elevated, especially in light of phentermine being planned, will start antihypertensives, preferably a beta-blocking agent such as Coreg.  Follow-up in 2 weeks for BP.   Medication Adjustments/Labs and Tests Ordered: Current medicines are reviewed at length with the patient today.  Concerns regarding medicines are outlined above.  Orders Placed This Encounter  Procedures  . EKG 12-Lead   No orders of the defined types were placed in this encounter.   Patient Instructions  Medication Instructions:  None Ordered *If you need a refill on your cardiac medications before your next appointment, please call your  pharmacy*   Lab Work: None Ordered If you have labs (blood work) drawn today and your tests are completely normal, you will receive your results only by: Marland Kitchen MyChart Message (if you have MyChart) OR . A paper copy in the mail If you have any lab test that is abnormal or we need to change your treatment, we will call you to review the results.   Testing/Procedures: None Ordered   Follow-Up: At [facility], you and your health needs are our priority.  As part of our continuing mission to provide you with exceptional heart care, we have created designated Provider Care Teams.  These Care Teams include your primary Cardiologist (physician) and Advanced Practice Providers (APPs -  Physician Assistants and Nurse Practitioners) who all work together to provide you with the care you need, when you need it.  We recommend signing up for the patient portal called "MyChart".  Sign up information is provided on this After Visit Summary.  MyChart is used to connect with patients for Virtual Visits (Telemedicine).  Patients are able to view lab/test results, encounter notes, upcoming appointments, etc.  Non-urgent messages can be sent to your provider as well.    To learn more about what you can do with MyChart, go to [email or web address].    Your next appointment:   2 week(s)  The format for your next appointment:   In Person  Provider:   Kate Sable, MD   Other Instructions N/A     Signed, Kate Sable, MD  08/05/2019 5:46 PM    [place]

## 2019-08-09 DIAGNOSIS — L0291 Cutaneous abscess, unspecified: Secondary | ICD-10-CM | POA: Diagnosis not present

## 2019-08-20 ENCOUNTER — Telehealth: Payer: Self-pay | Admitting: Internal Medicine

## 2019-08-20 NOTE — Telephone Encounter (Signed)
Left message for patient to call back and schedule Medicare Annual Wellness Visit (AWV)   This should be a telephone visit only=30 minutes.  Last AWV 08/14/15; please schedule at anytime with Denisa O'Brien-Blaney at [place]. 

## 2019-08-23 ENCOUNTER — Encounter: Payer: Self-pay | Admitting: Cardiology

## 2019-08-23 ENCOUNTER — Ambulatory Visit (INDEPENDENT_AMBULATORY_CARE_PROVIDER_SITE_OTHER): Payer: BC Managed Care – PPO | Admitting: Cardiology

## 2019-08-23 ENCOUNTER — Other Ambulatory Visit: Payer: Self-pay

## 2019-08-23 VITALS — BP 130/100 | HR 69 | Ht 63.0 in | Wt 187.5 lb

## 2019-08-23 DIAGNOSIS — I10 Essential (primary) hypertension: Secondary | ICD-10-CM | POA: Diagnosis not present

## 2019-08-23 DIAGNOSIS — Z6833 Body mass index (BMI) 33.0-33.9, adult: Secondary | ICD-10-CM

## 2019-08-23 NOTE — Patient Instructions (Signed)
Medication Instructions:   Please let us know if you feel you would like to start a Blood Pressure medication.  *If you need a refill on your cardiac medications before your next appointment, please call your pharmacy*   Lab Work: None Ordered If you have labs (blood work) drawn today and your tests are completely normal, you will receive your results only by: Marland Kitchen MyChart Message (if you have MyChart) OR . A paper copy in the mail If you have any lab test that is abnormal or we need to change your treatment, we will call you to review the results.   Testing/Procedures: None Ordered   Follow-Up: At [facility], you and your health needs are our priority.  As part of our continuing mission to provide you with exceptional heart care, we have created designated Provider Care Teams.  These Care Teams include your primary Cardiologist (physician) and Advanced Practice Providers (APPs -  Physician Assistants and Nurse Practitioners) who all work together to provide you with the care you need, when you need it.  We recommend signing up for the patient portal called "MyChart".  Sign up information is provided on this After Visit Summary.  MyChart is used to connect with patients for Virtual Visits (Telemedicine).  Patients are able to view lab/test results, encounter notes, upcoming appointments, etc.  Non-urgent messages can be sent to your provider as well.   To learn more about what you can do with MyChart, go to [email or web address].    Your next appointment:   Follow up as needed   The format for your next appointment:   In Person  Provider:   Kate Sable, MD   Other Instructions

## 2019-08-23 NOTE — Progress Notes (Signed)
Cardiology Office Note:    Date:  08/23/2019   ID:  Ashlee Graves, DOB 11/26/1985, MRN [id number]  PCP:  Crecencio Mc, MD  [facility] Cardiologist:  Kate Sable, MD  [facility] Electrophysiologist:  None   Referring MD: Crecencio Mc, MD   Chief Complaint  Patient presents with  . office visit    2 week F/U; Meds verbally reviewed with patient.    History of Present Illness:    Ashlee Graves is a 34 y.o. female with a hx of developmental delay, eating disorder, previously elevated blood pressure who presents for follow-up.  Last seen for cardiac eval prior to potential use of phentermine to help with weight loss.  Blood pressure was elevated after last visit.  Due to potentially starting phentermine, repeat blood pressure evaluation was recommended as one of the side effects of phentermine is hypertension.  She otherwise feels well, denies any symptoms of this point.  Past Medical History:  Diagnosis Date  . Allergy   . Depression   . Developmental delay, moderate     History reviewed. No pertinent surgical history.  Current Medications: Current Meds  Medication Sig  . azelastine (OPTIVAR) 0.05 % ophthalmic solution INSTILL 1 DROP INTO BOTH EYES TWICE A DAY  . citalopram (CELEXA) 10 MG tablet TAKE 1 TABLET BY MOUTH EVERY DAY  . ergocalciferol (DRISDOL) 1.25 MG (50000 UT) capsule Take 1 capsule (50,000 Units total) by mouth once a week.  . fexofenadine (ALLEGRA) 180 MG tablet Take 1 tablet (180 mg total) by mouth 2 (two) times daily as needed for allergies or rhinitis.  . fluticasone (FLONASE) 50 MCG/ACT nasal spray SPRAY 2 SPRAYS INTO EACH NOSTRIL EVERY DAY  . hydrOXYzine (ATARAX/VISTARIL) 25 MG tablet TAKE 1 TABLET (25 MG TOTAL) BY MOUTH 3 (THREE) TIMES DAILY AS NEEDED FOR ITCHING.  . Multiple Vitamin (MULTIVITAMIN) capsule Take 1 capsule by mouth daily.  . Multiple Vitamins-Minerals (HAIR VITAMINS PO) Take by mouth daily.   . Norgestimate-Ethinyl  Estradiol Triphasic (TRI-ESTARYLLA) 0.18/0.215/0.25 MG-35 MCG tablet Take 1 tablet by mouth daily.     Allergies:   Patient has no known allergies.   Social History   Socioeconomic History  . Marital status: Single    Spouse name: Not on file  . Number of children: Not on file  . Years of education: Not on file  . Highest education level: Not on file  Occupational History  . Not on file  Tobacco Use  . Smoking status: Never Smoker  . Smokeless tobacco: Never Used  Vaping Use  . Vaping Use: Never used  Substance and Sexual Activity  . Alcohol use: Yes    Comment: occasionally  . Drug use: No  . Sexual activity: Not on file  Other Topics Concern  . Not on file  Social History Narrative  . Not on file   Social Determinants of Health   Financial Resource Strain:   . Difficulty of Paying Living Expenses:   Food Insecurity:   . Worried About Charity fundraiser in the Last Year:   . Arboriculturist in the Last Year:   Transportation Needs:   . Film/video editor (Medical):   Marland Kitchen Lack of Transportation (Non-Medical):   Physical Activity:   . Days of Exercise per Week:   . Minutes of Exercise per Session:   Stress:   . Feeling of Stress :   Social Connections:   . Frequency of Communication with Friends and Family:   .  Frequency of Social Gatherings with Friends and Family:   . Attends Religious Services:   . Active Member of Clubs or Organizations:   . Attends Archivist Meetings:   Marland Kitchen Marital Status:      Family History: The patient's family history includes Arthritis/Rheumatoid in her mother; Cancer in her maternal grandfather, maternal grandmother, and paternal grandfather; Hyperlipidemia in her father; Hypertension in her father.  ROS:   Please see the history of present illness.     All other systems reviewed and are negative.  EKGs/Labs/Other Studies Reviewed:    The following studies were reviewed today:   EKG:  EKG is  ordered today.  The  ekg ordered today demonstrates normal sinus rhythm, normal ECG.  Recent Labs: 05/20/2019: ALT 11; BUN 6; Creatinine 0.7; Hemoglobin 14.8; Platelets 325; Potassium 4.3; Sodium 138; TSH 2.49  Recent Lipid Panel    Component Value Date/Time   CHOL 189 05/20/2019 0000   TRIG 78 05/20/2019 0000   HDL 48 05/20/2019 0000   LDLCALC 127 05/20/2019 0000    Physical Exam:    VS:  BP (!) 130/100 (BP Location: Left Arm, Patient Position: Sitting, Cuff Size: Large)   Pulse 69   Ht 5\' 3"  (1.6 m)   Wt 187 lb 8 oz (85 kg)   SpO2 98%   BMI 33.21 kg/m     Wt Readings from Last 3 Encounters:  08/23/19 187 lb 8 oz (85 kg)  08/05/19 186 lb (84.4 kg)  07/09/19 189 lb 3.2 oz (85.8 kg)     GEN:  Well nourished, well developed in no acute distress HEENT: Normal NECK: No JVD; No carotid bruits LYMPHATICS: No lymphadenopathy CARDIAC: RRR, no murmurs, rubs, gallops RESPIRATORY:  Clear to auscultation without rales, wheezing or rhonchi  ABDOMEN: Soft, non-tender, non-distended MUSCULOSKELETAL:  No edema; No deformity  SKIN: Warm and dry NEUROLOGIC:  Alert and oriented x 3 PSYCHIATRIC:  Normal affect   ASSESSMENT:    1. Essential hypertension   2. BMI 33.0-33.9,adult    PLAN:    In order of problems listed above:   1. Patient with history of elevated BP after last visit.  Repeat today is elevated.  I did multiple repeat blood pressures in the room office for father's recommendation with readings of 140/98, 138/100.  I recommended starting antihypertensive in light of patient planning on using phentermine in the near future.  Dad and patient will like to think about this prior to starting BP med.  Will prefer Coreg to start due to alpha, beta-blocking capability.  Reasoning and treatment for any discussed in detail to patient and father 2. History of obesity.  Low calorie diet, exercise advised  Follow-up as needed  Total encounter time 40 minutes  Greater than 50% was spent in counseling and  coordination of care with the patient    Medication Adjustments/Labs and Tests Ordered: Current medicines are reviewed at length with the patient today.  Concerns regarding medicines are outlined above.  Orders Placed This Encounter  Procedures  . EKG 12-Lead   No orders of the defined types were placed in this encounter.   Patient Instructions  Medication Instructions:   Please let us know if you feel you would like to start a Blood Pressure medication.  *If you need a refill on your cardiac medications before your next appointment, please call your pharmacy*   Lab Work: None Ordered If you have labs (blood work) drawn today and your tests are completely normal, you  will receive your results only by: Marland Kitchen MyChart Message (if you have MyChart) OR . A paper copy in the mail If you have any lab test that is abnormal or we need to change your treatment, we will call you to review the results.   Testing/Procedures: None Ordered   Follow-Up: At [facility], you and your health needs are our priority.  As part of our continuing mission to provide you with exceptional heart care, we have created designated Provider Care Teams.  These Care Teams include your primary Cardiologist (physician) and Advanced Practice Providers (APPs -  Physician Assistants and Nurse Practitioners) who all work together to provide you with the care you need, when you need it.  We recommend signing up for the patient portal called "MyChart".  Sign up information is provided on this After Visit Summary.  MyChart is used to connect with patients for Virtual Visits (Telemedicine).  Patients are able to view lab/test results, encounter notes, upcoming appointments, etc.  Non-urgent messages can be sent to your provider as well.   To learn more about what you can do with MyChart, go to [email or web address].    Your next appointment:   Follow up as needed   The format for your next appointment:   In  Person  Provider:   Kate Sable, MD   Other Instructions      Signed, Kate Sable, MD  08/23/2019 5:03 PM    [place]

## 2019-08-27 ENCOUNTER — Other Ambulatory Visit: Payer: Self-pay | Admitting: Internal Medicine

## 2019-09-09 ENCOUNTER — Ambulatory Visit (INDEPENDENT_AMBULATORY_CARE_PROVIDER_SITE_OTHER): Payer: BC Managed Care – PPO | Admitting: Internal Medicine

## 2019-09-09 ENCOUNTER — Other Ambulatory Visit: Payer: Self-pay

## 2019-09-09 ENCOUNTER — Encounter: Payer: Self-pay | Admitting: Internal Medicine

## 2019-09-09 VITALS — BP 124/78 | HR 59 | Temp 98.2°F | Resp 15 | Ht 63.0 in | Wt 186.2 lb

## 2019-09-09 DIAGNOSIS — R635 Abnormal weight gain: Secondary | ICD-10-CM | POA: Diagnosis not present

## 2019-09-09 DIAGNOSIS — R1110 Vomiting, unspecified: Secondary | ICD-10-CM

## 2019-09-09 DIAGNOSIS — R1011 Right upper quadrant pain: Secondary | ICD-10-CM

## 2019-09-09 DIAGNOSIS — E669 Obesity, unspecified: Secondary | ICD-10-CM | POA: Diagnosis not present

## 2019-09-09 MED ORDER — PHENTERMINE HCL 37.5 MG PO TABS: 37.5000 mg | ORAL_TABLET | Freq: Every day | ORAL | 2 refills | Status: DC

## 2019-09-09 NOTE — Patient Instructions (Addendum)
  I have ordered an ultrasound of your gallbladder along with labs   I am prescribing phentermine to help control your appetite.    Please start with 1/2 tablet daily to use during the time you are most likely to overeat

## 2019-09-09 NOTE — Progress Notes (Signed)
Subjective:  Patient ID: Ashlee Graves, female    DOB: [birth year]  Age: 34 y.o. MRN: [id number]  CC: The primary encounter diagnosis was Postprandial RUQ pain. Diagnoses of Excessive weight gain, Morbid obesity ([place]), Obesity (BMI 30.0-34.9), and Recurrent vomiting were also pertinent to this visit.  HPI Ashlee Graves presents for follow up on obesity secondary to uncontrolled appetite /eating disorder in a patient with limited cognitive abiilities.  She is accompanied by her mother who is legal guardian  This visit occurred during the SARS-CoV-2 public health emergency.  Safety protocols were in place, including screening questions prior to the visit, additional usage of staff PPE, and extensive cleaning of exam room while observing appropriate contact time as indicated for disinfecting solutions.    Patient has received both doses of the [place] 19 vaccine without complications.  Patient continues to mask when outside of the home except when walking in yard or at safe distances from others .  Patient denies any change in mood or development of unhealthy behaviors resuting from the pandemic's restriction of activities and socialization.    Regarding her obesity,  Family's request for resuming phentermine was discussed again today.  Contrave was not effective. Family has changed her diet somewhat , using lean cuisine meals, but she continues to overeat when not supervised closely,  Especially when visiting her sister. She has lost one lb in one month ; BMI 32..  Mother reports two recent unprovoked vomiting episodes 1) fist one was  an episode of projectile vomiting that occurred while vacationing with family  at the beach.  Lasted less than 24 hours.  2) 2nd episode occurred on the way home from the beach, immediately after taking a bite of a fast food hamburger.  There was no report of fevers,  Diarrhea,  Excessive alcohol or food intake.    Mother is worried about gallbladder since her  other daughter (Patient's sister) had cholecystitis at same age.   Outpatient Medications Prior to Visit  Medication Sig Dispense Refill  . azelastine (OPTIVAR) 0.05 % ophthalmic solution INSTILL 1 DROP INTO BOTH EYES TWICE A DAY 18 mL 4  . citalopram (CELEXA) 10 MG tablet TAKE 1 TABLET BY MOUTH EVERY DAY 90 tablet 1  . fexofenadine (ALLEGRA) 180 MG tablet Take 1 tablet (180 mg total) by mouth 2 (two) times daily as needed for allergies or rhinitis. 60 tablet 11  . fluticasone (FLONASE) 50 MCG/ACT nasal spray SPRAY 2 SPRAYS INTO EACH NOSTRIL EVERY DAY 48 mL 3  . hydrOXYzine (ATARAX/VISTARIL) 25 MG tablet TAKE 1 TABLET (25 MG TOTAL) BY MOUTH 3 (THREE) TIMES DAILY AS NEEDED FOR ITCHING. 270 tablet 1  . Multiple Vitamin (MULTIVITAMIN) capsule Take 1 capsule by mouth daily.    . Multiple Vitamins-Minerals (HAIR VITAMINS PO) Take by mouth daily.     . Norgestimate-Ethinyl Estradiol Triphasic (TRI-ESTARYLLA) 0.18/0.215/0.25 MG-35 MCG tablet Take 1 tablet by mouth daily. 84 tablet 3  . Vitamin D, Ergocalciferol, (DRISDOL) 1.25 MG (50000 UNIT) CAPS capsule TAKE 1 CAPSULE BY MOUTH ONE TIME PER WEEK 12 capsule 1   No facility-administered medications prior to visit.    Review of Systems;  Patient denies headache, fevers, malaise, unintentional weight loss, skin rash, eye pain, sinus congestion and sinus pain, sore throat, dysphagia,  hemoptysis , cough, dyspnea, wheezing, chest pain, palpitations, orthopnea, edema, abdominal pain, nausea, melena, diarrhea, constipation, flank pain, dysuria, hematuria, urinary  Frequency, nocturia, numbness, tingling, seizures,  Focal weakness, Loss of consciousness,  Tremor, insomnia,  depression, anxiety, and suicidal ideation.      Objective:  BP 124/78 (BP Location: Left Arm, Patient Position: Sitting, Cuff Size: Normal)   Pulse (!) 59   Temp 98.2 F (36.8 C) (Oral)   Resp 15   Ht 5\' 3"  (1.6 m)   Wt 186 lb 3.2 oz (84.5 kg)   SpO2 98%   BMI 32.98 kg/m   BP  Readings from Last 3 Encounters:  09/09/19 124/78  08/23/19 (!) 130/100  08/05/19 (!) 138/108    Wt Readings from Last 3 Encounters:  09/09/19 186 lb 3.2 oz (84.5 kg)  08/23/19 187 lb 8 oz (85 kg)  08/05/19 186 lb (84.4 kg)    General appearance: alert, cooperative and appears stated age Ears: normal TM's and external ear canals both ears Throat: lips, mucosa, and tongue normal; teeth and gums normal Neck: no adenopathy, no carotid bruit, supple, symmetrical, trachea midline and thyroid not enlarged, symmetric, no tenderness/mass/nodules Back: symmetric, no curvature. ROM normal. No CVA tenderness. Lungs: clear to auscultation bilaterally Heart: regular rate and rhythm, S1, S2 normal, no murmur, click, rub or gallop Breast:  Symmetric, no masses  Abdomen: soft, non-tender; bowel sounds normal; no masses,  Some fullness in the RUQ without tenderness  Pulses: 2+ and symmetric Skin: Skin color, texture, turgor normal. No rashes or lesions Lymph nodes: Cervical, supraclavicular, and axillary nodes normal.  Lab Results  Component Value Date   HGBA1C 5.2 05/21/2019   HGBA1C 5.1 08/25/2016   HGBA1C 4.9 12/17/2015    Lab Results  Component Value Date   CREATININE 0.7 05/20/2019   CREATININE 0.6 10/27/2017   CREATININE 0.6 10/20/2017    Lab Results  Component Value Date   WBC 8.1 05/20/2019   HGB 14.8 05/20/2019   HCT 45 05/20/2019   PLT 325 05/20/2019   CHOL 189 05/20/2019   TRIG 78 05/20/2019   HDL 48 05/20/2019   LDLCALC 127 05/20/2019   ALT 11 05/20/2019   AST 23 05/20/2019   NA 138 05/20/2019   K 4.3 05/20/2019   CL 101 05/20/2019   CREATININE 0.7 05/20/2019   BUN 6 05/20/2019   TSH 2.49 05/20/2019   HGBA1C 5.2 05/21/2019    No results found.  Assessment & Plan:   Problem List Items Addressed This Visit      Unprioritized   Obesity (BMI 30.0-34.9)    She is unable to lose weight without pharmacotherapy due to insatiable appetite.  At this point , I feel  that the risks of developing diabetes, fatty liver,  And hypertension by remaining obese outweigh the risks of judicious phentermine use.  Discussed with mother and advised her to use the lowest dose possible to curb patient's appetite and to achieve BMI > 30.       Recurrent vomiting    Postprandial episodes occurring after fatty meals raises possibility of choledocholithiasis.  RUQ [place] ordered along with liver enzymes        Other Visit Diagnoses    Postprandial RUQ pain    -  Primary   Relevant Orders   US Abdomen Limited RUQ   Excessive weight gain       Relevant Orders   Comprehensive metabolic panel   Morbid obesity ([place])       Relevant Medications   phentermine (ADIPEX-P) 37.5 MG tablet   Other Relevant Orders   Lipid panel      I am having Illene Labrador start on phentermine. I am also having her maintain  her multivitamin, Multiple Vitamins-Minerals (HAIR VITAMINS PO), fexofenadine, azelastine, fluticasone, citalopram, hydrOXYzine, Norgestimate-Ethinyl Estradiol Triphasic, and Vitamin D (Ergocalciferol).  Meds ordered this encounter  Medications  . phentermine (ADIPEX-P) 37.5 MG tablet    Sig: Take 1 tablet (37.5 mg total) by mouth daily before breakfast.    Dispense:  30 tablet    Refill:  2    There are no discontinued medications.  Follow-up: Return in about 3 months (around 12/10/2019).   Crecencio Mc, MD

## 2019-09-10 DIAGNOSIS — R1110 Vomiting, unspecified: Secondary | ICD-10-CM | POA: Insufficient documentation

## 2019-09-10 DIAGNOSIS — Z0189 Encounter for other specified special examinations: Secondary | ICD-10-CM | POA: Diagnosis not present

## 2019-09-10 NOTE — Assessment & Plan Note (Signed)
Postprandial episodes occurring after fatty meals raises possibility of choledocholithiasis.  RUQ [place] ordered along with liver enzymes

## 2019-09-10 NOTE — Assessment & Plan Note (Signed)
She is unable to lose weight without pharmacotherapy due to insatiable appetite.  At this point , I feel that the risks of developing diabetes, fatty liver,  And hypertension by remaining obese outweigh the risks of judicious phentermine use.  Discussed with mother and advised her to use the lowest dose possible to curb patient's appetite and to achieve BMI > 30.

## 2019-09-13 ENCOUNTER — Other Ambulatory Visit: Payer: Self-pay

## 2019-09-13 ENCOUNTER — Ambulatory Visit
Admission: RE | Admit: 2019-09-13 | Discharge: 2019-09-13 | Disposition: A | Discharge status: Home / Self Care | Payer: BC Managed Care – PPO | Source: Ambulatory Visit | Attending: Internal Medicine | Admitting: Internal Medicine

## 2019-09-13 DIAGNOSIS — R1011 Right upper quadrant pain: Secondary | ICD-10-CM | POA: Diagnosis not present

## 2019-09-13 DIAGNOSIS — K760 Fatty (change of) liver, not elsewhere classified: Secondary | ICD-10-CM | POA: Diagnosis not present

## 2019-09-13 DIAGNOSIS — K8050 Calculus of bile duct without cholangitis or cholecystitis without obstruction: Secondary | ICD-10-CM | POA: Diagnosis not present

## 2019-09-13 LAB — LIPID PANEL
Cholesterol: 189 (ref 0–200)
HDL: 54 (ref 35–70)
LDL Cholesterol: 120
Triglycerides: 82 (ref 40–160)

## 2019-09-13 LAB — BASIC METABOLIC PANEL
BUN: 12 (ref 4–21)
CO2: 25 — AB (ref 13–22)
Chloride: 102 (ref 99–108)
Creatinine: 0.7 (ref 0.5–1.1)
Glucose: 75
Potassium: 4.6 (ref 3.4–5.3)
Sodium: 140 (ref 137–147)

## 2019-09-13 LAB — COMPREHENSIVE METABOLIC PANEL
Albumin: 4.3 (ref 3.5–5.0)
Calcium: 9.2 (ref 8.7–10.7)
GFR calc Af Amer: 131
GFR calc non Af Amer: 113
Globulin: 3

## 2019-09-13 LAB — HEPATIC FUNCTION PANEL
ALT: 13 (ref 7–35)
AST: 14 (ref 13–35)
Alkaline Phosphatase: 58 (ref 25–125)
Bilirubin, Total: 0.2

## 2019-09-17 ENCOUNTER — Encounter: Payer: Self-pay | Admitting: Internal Medicine

## 2019-09-17 DIAGNOSIS — K8020 Calculus of gallbladder without cholecystitis without obstruction: Secondary | ICD-10-CM | POA: Insufficient documentation

## 2019-09-23 ENCOUNTER — Telehealth: Payer: Self-pay | Admitting: Internal Medicine

## 2019-09-23 NOTE — Telephone Encounter (Signed)
Spoke with pt's mother and informed her of pt's lab results. Mother gave a verbal understanding but wanted to know if the pt needs to increase the phentermine to a whole tablet or continue taking the 1/2 tablet. Spoke with Dr. Derrel Nip verbally and she stated that as long as the pt is losing weight on the 1/2 tablet to continue with it. Mother gave a verbal understanding.

## 2019-09-23 NOTE — Telephone Encounter (Signed)
Her recent labs were normal , including liver enzymes.  No evidence of sick gallbladder /cholecystitis \ Regards,   Deborra Medina, MD

## 2019-10-08 ENCOUNTER — Other Ambulatory Visit: Payer: Self-pay

## 2019-10-15 DIAGNOSIS — K219 Gastro-esophageal reflux disease without esophagitis: Secondary | ICD-10-CM | POA: Diagnosis not present

## 2019-10-15 DIAGNOSIS — R070 Pain in throat: Secondary | ICD-10-CM | POA: Diagnosis not present

## 2019-12-13 ENCOUNTER — Other Ambulatory Visit: Payer: Self-pay

## 2019-12-13 ENCOUNTER — Encounter: Payer: Self-pay | Admitting: Internal Medicine

## 2019-12-13 ENCOUNTER — Ambulatory Visit (INDEPENDENT_AMBULATORY_CARE_PROVIDER_SITE_OTHER): Payer: BC Managed Care – PPO | Admitting: Internal Medicine

## 2019-12-13 DIAGNOSIS — K2100 Gastro-esophageal reflux disease with esophagitis, without bleeding: Secondary | ICD-10-CM | POA: Diagnosis not present

## 2019-12-13 DIAGNOSIS — E669 Obesity, unspecified: Secondary | ICD-10-CM | POA: Diagnosis not present

## 2019-12-13 DIAGNOSIS — R1110 Vomiting, unspecified: Secondary | ICD-10-CM | POA: Diagnosis not present

## 2019-12-13 MED ORDER — OMEPRAZOLE 40 MG PO CPDR
40.0000 mg | DELAYED_RELEASE_CAPSULE | Freq: Every day | ORAL | 1 refills | Status: DC
Start: 2019-12-13 — End: 2020-06-18

## 2019-12-13 MED ORDER — PHENTERMINE HCL 37.5 MG PO TABS
37.5000 mg | ORAL_TABLET | Freq: Every day | ORAL | 2 refills | Status: DC
Start: 2019-12-13 — End: 2020-04-15

## 2019-12-13 NOTE — Assessment & Plan Note (Signed)
Per ENT evaluation.  continue omeprazole daily

## 2019-12-13 NOTE — Patient Instructions (Signed)
I agree that more suppression of appetite is needed  Increase phentermine dose to full tablet daily in divided doses  Goal is  9 lbs over the next 3 months

## 2019-12-13 NOTE — Progress Notes (Signed)
Subjective:  Patient ID: Ashlee Graves, female    DOB: 1985-05-01  Age: 34 y.o. MRN: [id number]  CC: Diagnoses of Obesity (BMI 30.0-34.9), Recurrent vomiting, and Gastroesophageal reflux disease with esophagitis without hemorrhage were pertinent to this visit.  HPI Ashlee Graves presents for follow up on obesity and weight management.  She is accompanied by her mother.   This visit occurred during the SARS-CoV-2 public health emergency.  Safety protocols were in place, including screening questions prior to the visit, additional usage of staff PPE, and extensive cleaning of exam room while observing appropriate contact time as indicated for disinfecting solutions.    Patient has received both doses of the available COVID 19 vaccine without complications.  Patient continues to mask when outside of the home except when walking in yard or at safe distances from others .  Patient denies any change in mood or development of unhealthy behaviors resuting from the pandemic's restriction of activities and socialization.    taking 1/2 phentermine daily .  Wt loss of 6 lbs since last visit  No side effects.  Disappointed at lack of significant change . 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    Had an episode of throat pain, clearing her throat  After swallowing gum . Saw ENT.  No  Gum  Just signs of heartburn  Taking prilosec 40 mg since then   Outpatient Medications Prior to Visit  Medication Sig Dispense Refill  . azelastine (OPTIVAR) 0.05 % ophthalmic solution INSTILL 1 DROP INTO BOTH EYES TWICE A DAY 18 mL 4  . citalopram (CELEXA) 10 MG tablet TAKE 1 TABLET BY MOUTH EVERY DAY 90 tablet 1  . fexofenadine (ALLEGRA) 180 MG tablet Take 1 tablet (180 mg total) by mouth 2 (two) times daily as needed for allergies or rhinitis. 60 tablet 11  . fluticasone (FLONASE) 50 MCG/ACT nasal spray SPRAY 2 SPRAYS INTO EACH NOSTRIL EVERY DAY 48 mL 3  . hydrOXYzine  (ATARAX/VISTARIL) 25 MG tablet TAKE 1 TABLET (25 MG TOTAL) BY MOUTH 3 (THREE) TIMES DAILY AS NEEDED FOR ITCHING. 270 tablet 1  . Multiple Vitamin (MULTIVITAMIN) capsule Take 1 capsule by mouth daily.    . Multiple Vitamins-Minerals (HAIR VITAMINS PO) Take by mouth daily.     . Norgestimate-Ethinyl Estradiol Triphasic (TRI-ESTARYLLA) 0.18/0.215/0.25 MG-35 MCG tablet Take 1 tablet by mouth daily. 84 tablet 3  . omeprazole (PRILOSEC) 40 MG capsule Take 40 mg by mouth daily.    . phentermine (ADIPEX-P) 37.5 MG tablet Take 1 tablet (37.5 mg total) by mouth daily before breakfast. 30 tablet 2  . Vitamin D, Ergocalciferol, (DRISDOL) 1.25 MG (50000 UNIT) CAPS capsule TAKE 1 CAPSULE BY MOUTH ONE TIME PER WEEK (Patient not taking: Reported on 12/13/2019) 12 capsule 1   No facility-administered medications prior to visit.    Review of Systems;  Patient denies headache, fevers, malaise, unintentional weight loss, skin rash, eye pain, sinus congestion and sinus pain, sore throat, dysphagia,  hemoptysis , cough, dyspnea, wheezing, chest pain, palpitations, orthopnea, edema, abdominal pain, nausea, melena, diarrhea, constipation, flank pain, dysuria, hematuria, urinary  Frequency, nocturia, numbness, tingling, seizures,  Focal weakness, Loss of consciousness,  Tremor, insomnia, depression, anxiety, and suicidal ideation.      Objective:  BP (!) 130/92 (BP Location: Left Arm, Patient Position: Sitting, Cuff Size: Large)   Pulse 78   Temp 98.2 F (36.8 C) (Oral)   Resp 14   Ht 5\' 3"  (1.6 m)  Wt 180 lb 9.6 oz (81.9 kg)   SpO2 98%   BMI 31.99 kg/m   BP Readings from Last 3 Encounters:  12/13/19 (!) 130/92  09/09/19 124/78  08/23/19 (!) 130/100    Wt Readings from Last 3 Encounters:  12/13/19 180 lb 9.6 oz (81.9 kg)  09/09/19 186 lb 3.2 oz (84.5 kg)  08/23/19 187 lb 8 oz (85 kg)    General appearance: alert, cooperative and appears stated age Ears: normal TM's and external ear canals both  ears Throat: lips, mucosa, and tongue normal; teeth and gums normal Neck: no adenopathy, no carotid bruit, supple, symmetrical, trachea midline and thyroid not enlarged, symmetric, no tenderness/mass/nodules Back: symmetric, no curvature. ROM normal. No CVA tenderness. Lungs: clear to auscultation bilaterally Heart: regular rate and rhythm, S1, S2 normal, no murmur, click, rub or gallop Abdomen: soft, non-tender; bowel sounds normal; no masses,  no organomegaly Pulses: 2+ and symmetric Skin: Skin color, texture, turgor normal. No rashes or lesions Lymph nodes: Cervical, supraclavicular, and axillary nodes normal.  Lab Results  Component Value Date   HGBA1C 5.2 05/21/2019   HGBA1C 5.1 08/25/2016   HGBA1C 4.9 12/17/2015    Lab Results  Component Value Date   CREATININE 0.7 09/13/2019   CREATININE 0.7 05/20/2019   CREATININE 0.6 10/27/2017    Lab Results  Component Value Date   WBC 8.1 05/20/2019   HGB 14.8 05/20/2019   HCT 45 05/20/2019   PLT 325 05/20/2019   CHOL 189 09/13/2019   TRIG 82 09/13/2019   HDL 54 09/13/2019   LDLCALC 120 09/13/2019   ALT 13 09/13/2019   AST 14 09/13/2019   NA 140 09/13/2019   K 4.6 09/13/2019   CL 102 09/13/2019   CREATININE 0.7 09/13/2019   BUN 12 09/13/2019   CO2 25 (A) 09/13/2019   TSH 2.49 05/20/2019   HGBA1C 5.2 05/21/2019    US Abdomen Limited RUQ  Result Date: 09/13/2019 CLINICAL DATA:  Recurrent vomiting and right upper quadrant pain. EXAM: ULTRASOUND ABDOMEN LIMITED RIGHT UPPER QUADRANT COMPARISON:  None. FINDINGS: Gallbladder: No filled with sludge and shadowing stones. Physiologically distended. There is no wall thickening or pericholecystic fluid. No sonographic Murphy sign noted by sonographer. Common bile duct: Diameter: 6-7 mm. Distal portion is obscured by overlying bowel gas. No choledocholithiasis in the visualized portion. Liver: No focal lesion identified. Heterogeneous and mildly increased in parenchymal echogenicity.  Portal vein is patent on color Doppler imaging with normal direction of blood flow towards the liver. Other: No right upper quadrant ascites/free fluid. IMPRESSION: 1. Sludge and stones in the gallbladder. No sonographic findings of acute cholecystitis. 2. Common bile duct at 6-7 mm, borderline dilated. No choledocholithiasis in the visualized portion. Recommend correlation with LFTs. If LFTs are elevated, recommend further evaluation with MRCP. 3. Borderline mild hepatic steatosis. Electronically Signed   By: Keith Rake M.D.   On: 09/13/2019 15:56    Assessment & Plan:   Problem List Items Addressed This Visit      Unprioritized   Reflux esophagitis    Per ENT evaluation.  continue omeprazole daily       Recurrent vomiting    [place] was normal.  Symptoms have stopped.      Obesity (BMI 30.0-34.9)    Increase phentermine to 37.5 mg daily in divided doses.          I have discontinued Caryl Pina Stanzione's Vitamin D (Ergocalciferol). I have also changed her omeprazole. Additionally, I am having her maintain her  multivitamin, Multiple Vitamins-Minerals (HAIR VITAMINS PO), fexofenadine, azelastine, fluticasone, citalopram, hydrOXYzine, Norgestimate-Ethinyl Estradiol Triphasic, and phentermine.  Meds ordered this encounter  Medications  . omeprazole (PRILOSEC) 40 MG capsule    Sig: Take 1 capsule (40 mg total) by mouth daily.    Dispense:  90 capsule    Refill:  1  . phentermine (ADIPEX-P) 37.5 MG tablet    Sig: Take 1 tablet (37.5 mg total) by mouth daily before breakfast.    Dispense:  30 tablet    Refill:  2    Medications Discontinued During This Encounter  Medication Reason  . Vitamin D, Ergocalciferol, (DRISDOL) 1.25 MG (50000 UNIT) CAPS capsule Completed Course  . omeprazole (PRILOSEC) 40 MG capsule Reorder  . phentermine (ADIPEX-P) 37.5 MG tablet Reorder    Follow-up: Return in about 3 months (around 03/14/2020).   Crecencio Mc, MD

## 2019-12-13 NOTE — Assessment & Plan Note (Signed)
Increase phentermine to 37.5 mg daily in divided doses.

## 2019-12-13 NOTE — Assessment & Plan Note (Signed)
[place] was normal.  Symptoms have stopped.

## 2019-12-27 DIAGNOSIS — Z23 Encounter for immunization: Secondary | ICD-10-CM | POA: Diagnosis not present

## 2020-01-28 ENCOUNTER — Telehealth: Payer: Self-pay | Admitting: Internal Medicine

## 2020-01-28 NOTE — Telephone Encounter (Signed)
Left message for patient to call back and schedule Medicare Annual Wellness Visit (AWV)   This should be a telephone visit only=30 minutes.  Last AWV 08/14/15; please schedule at anytime with Denisa O'Brien-Blaney at [facility]. 

## 2020-03-04 ENCOUNTER — Other Ambulatory Visit: Payer: Self-pay | Admitting: Internal Medicine

## 2020-03-27 ENCOUNTER — Other Ambulatory Visit: Payer: Self-pay

## 2020-03-27 ENCOUNTER — Encounter: Payer: Self-pay | Admitting: Internal Medicine

## 2020-03-27 ENCOUNTER — Ambulatory Visit (INDEPENDENT_AMBULATORY_CARE_PROVIDER_SITE_OTHER): Payer: BC Managed Care – PPO | Admitting: Internal Medicine

## 2020-03-27 VITALS — BP 150/82 | HR 84 | Temp 98.2°F | Ht 63.0 in | Wt 165.4 lb

## 2020-03-27 DIAGNOSIS — F5089 Other specified eating disorder: Secondary | ICD-10-CM | POA: Diagnosis not present

## 2020-03-27 DIAGNOSIS — R790 Abnormal level of blood mineral: Secondary | ICD-10-CM | POA: Diagnosis not present

## 2020-03-27 DIAGNOSIS — K130 Diseases of lips: Secondary | ICD-10-CM

## 2020-03-27 DIAGNOSIS — R030 Elevated blood-pressure reading, without diagnosis of hypertension: Secondary | ICD-10-CM | POA: Diagnosis not present

## 2020-03-27 DIAGNOSIS — E669 Obesity, unspecified: Secondary | ICD-10-CM | POA: Diagnosis not present

## 2020-03-27 DIAGNOSIS — F09 Unspecified mental disorder due to known physiological condition: Secondary | ICD-10-CM | POA: Diagnosis not present

## 2020-03-27 DIAGNOSIS — Z79899 Other long term (current) drug therapy: Secondary | ICD-10-CM

## 2020-03-27 DIAGNOSIS — I10 Essential (primary) hypertension: Secondary | ICD-10-CM | POA: Insufficient documentation

## 2020-03-27 LAB — VITAMIN B12: Vitamin B-12: 422

## 2020-03-27 LAB — TSH: TSH: 1.7 (ref 0.41–5.90)

## 2020-03-27 LAB — BASIC METABOLIC PANEL
BUN: 8 (ref 4–21)
CO2: 22 (ref 13–22)
Chloride: 102 (ref 99–108)
Creatinine: 0.7 (ref 0.5–1.1)
Glucose: 105
Potassium: 4.4 (ref 3.4–5.3)
Sodium: 137 (ref 137–147)

## 2020-03-27 LAB — CBC AND DIFFERENTIAL
Hemoglobin: 13.6 (ref 12.0–16.0)
Platelets: 328 (ref 150–399)
WBC: 7.9

## 2020-03-27 LAB — LIPID PANEL
Cholesterol: 201 — AB (ref 0–200)
HDL: 53 (ref 35–70)
LDL Cholesterol: 121
Triglycerides: 153 (ref 40–160)

## 2020-03-27 LAB — COMPREHENSIVE METABOLIC PANEL
Albumin: 4 (ref 3.5–5.0)
Calcium: 9 (ref 8.7–10.7)

## 2020-03-27 LAB — HEMOGLOBIN A1C: Hemoglobin A1C: 5.1

## 2020-03-27 LAB — HEPATIC FUNCTION PANEL
ALT: 25 (ref 7–35)
AST: 22 (ref 13–35)
Alkaline Phosphatase: 66 (ref 25–125)
Bilirubin, Total: 0.2

## 2020-03-27 MED ORDER — TRIAMCINOLONE ACETONIDE 0.1 % EX CREA: 1.0000 "application " | TOPICAL_CREAM | Freq: Two times a day (BID) | CUTANEOUS | 0 refills | Status: DC

## 2020-03-27 NOTE — Assessment & Plan Note (Addendum)
CONFIRMED ON REPEAT CHECK.  MAY BE MEDICATION SIDE EFFECT VS ANXIETY.  DOES NOT USE NSAIDS . Mother will check BP at home after reducing dose to 1/2 tablet daily of phentermine  And if ti remains elevated  Will need to sop the medication

## 2020-03-27 NOTE — Progress Notes (Signed)
Subjective:  Patient ID: Ashlee Graves, female    DOB: [birth year]  Age: 35 y.o. MRN: [id number]  CC: The primary encounter diagnosis was Long-term current use of high risk medication other than anticoagulant. Diagnoses of Abnormal serum iron level, Simple cheilitis glandularis, Obesity (BMI 30.0-34.9), Elevated blood-pressure reading without diagnosis of hypertension, Acquired cognitive dysfunction, and Other disorder of eating were also pertinent to this visit.  HPI [name] presents for follow up on weight management with phentermine  This visit occurred during the SARS-CoV-2 public health emergency.  Safety protocols were in place, including screening questions prior to the visit, additional usage of staff PPE, and extensive cleaning of exam room while observing appropriate contact time as indicated for disinfecting solutions.    Weight loss of 15 lbs noted since her last visit 3 months ago . Using a full phentermine tablet daily.  No side effects per mother,  However, her blood pressure is elevated today to 150/82.  Mother has not checked her blood pressure at home.   Cheilosis  X 1 month.  Does not drink water ,  Prefers diet drinks .  Mother has been having her apply neosporin  To lips.  No new cosmetics,  Moisturizers or soap s  Outpatient Medications Prior to Visit  Medication Sig Dispense Refill  . azelastine (OPTIVAR) 0.05 % ophthalmic solution INSTILL 1 DROP INTO BOTH EYES TWICE A DAY 18 mL 4  . citalopram (CELEXA) 10 MG tablet TAKE 1 TABLET BY MOUTH EVERY DAY 90 tablet 1  . fexofenadine (ALLEGRA) 180 MG tablet Take 1 tablet (180 mg total) by mouth 2 (two) times daily as needed for allergies or rhinitis. 60 tablet 11  . fluticasone (FLONASE) 50 MCG/ACT nasal spray SPRAY 2 SPRAYS INTO EACH NOSTRIL EVERY DAY 48 mL 3  . hydrOXYzine (ATARAX/VISTARIL) 25 MG tablet TAKE 1 TABLET (25 MG TOTAL) BY MOUTH 3 (THREE) TIMES DAILY AS NEEDED FOR ITCHING. 270 tablet 1  . Multiple Vitamin  (MULTIVITAMIN) capsule Take 1 capsule by mouth daily.    . Multiple Vitamins-Minerals (HAIR VITAMINS PO) Take by mouth daily.     . Norgestimate-Ethinyl Estradiol Triphasic (TRI-ESTARYLLA) 0.18/0.215/0.25 MG-35 MCG tablet Take 1 tablet by mouth daily. 84 tablet 3  . omeprazole (PRILOSEC) 40 MG capsule Take 1 capsule (40 mg total) by mouth daily. 90 capsule 1  . phentermine (ADIPEX-P) 37.5 MG tablet Take 1 tablet (37.5 mg total) by mouth daily before breakfast. 30 tablet 2   No facility-administered medications prior to visit.    Review of Systems;  Patient denies headache, fevers, malaise, unintentional weight loss, skin rash, eye pain, sinus congestion and sinus pain, sore throat, dysphagia,  hemoptysis , cough, dyspnea, wheezing, chest pain, palpitations, orthopnea, edema, abdominal pain, nausea, melena, diarrhea, constipation, flank pain, dysuria, hematuria, urinary  Frequency, nocturia, numbness, tingling, seizures,  Focal weakness, Loss of consciousness,  Tremor, insomnia, depression, anxiety, and suicidal ideation.      Objective:  BP (!) 150/82   Pulse 84   Temp 98.2 F (36.8 C)   Ht 5\' 3"  (1.6 m)   Wt 165 lb 6 oz (75 kg)   SpO2 98%   BMI 29.29 kg/m   BP Readings from Last 3 Encounters:  03/27/20 (!) 150/82  12/13/19 (!) 130/92  09/09/19 124/78    Wt Readings from Last 3 Encounters:  03/27/20 165 lb 6 oz (75 kg)  12/13/19 180 lb 9.6 oz (81.9 kg)  09/09/19 186 lb 3.2 oz (84.5 kg)  General appearance: alert, cooperative and appears stated age Ears: normal TM's and external ear canals both ears Throat: lips not chapped or blistered,  But mild periorbital erythema mucoosa,  tongue ,  teeth and gums normal Neck: no adenopathy, no carotid bruit, supple, symmetrical, trachea midline and thyroid not enlarged, symmetric, no tenderness/mass/nodules Back: symmetric, no curvature. ROM normal. No CVA tenderness. Lungs: clear to auscultation bilaterally Heart: regular rate and  rhythm, S1, S2 normal, no murmur, click, rub or gallop Abdomen: soft, non-tender; bowel sounds normal; no masses,  no organomegaly Pulses: 2+ and symmetric Skin: Skin color, texture, turgor normal. No rashes or lesions Lymph nodes: Cervical, supraclavicular, and axillary nodes normal.  Lab Results  Component Value Date   HGBA1C 5.2 05/21/2019   HGBA1C 5.1 08/25/2016   HGBA1C 4.9 12/17/2015    Lab Results  Component Value Date   CREATININE 0.7 09/13/2019   CREATININE 0.7 05/20/2019   CREATININE 0.6 10/27/2017    Lab Results  Component Value Date   WBC 8.1 05/20/2019   HGB 14.8 05/20/2019   HCT 45 05/20/2019   PLT 325 05/20/2019   CHOL 189 09/13/2019   TRIG 82 09/13/2019   HDL 54 09/13/2019   LDLCALC 120 09/13/2019   ALT 13 09/13/2019   AST 14 09/13/2019   NA 140 09/13/2019   K 4.6 09/13/2019   CL 102 09/13/2019   CREATININE 0.7 09/13/2019   BUN 12 09/13/2019   CO2 25 (A) 09/13/2019   TSH 2.49 05/20/2019   HGBA1C 5.2 05/21/2019    US Abdomen Limited RUQ  Result Date: 09/13/2019 CLINICAL DATA:  Recurrent vomiting and right upper quadrant pain. EXAM: ULTRASOUND ABDOMEN LIMITED RIGHT UPPER QUADRANT COMPARISON:  None. FINDINGS: Gallbladder: No filled with sludge and shadowing stones. Physiologically distended. There is no wall thickening or pericholecystic fluid. No sonographic Murphy sign noted by sonographer. Common bile duct: Diameter: 6-7 mm. Distal portion is obscured by overlying bowel gas. No choledocholithiasis in the visualized portion. Liver: No focal lesion identified. Heterogeneous and mildly increased in parenchymal echogenicity. Portal vein is patent on color Doppler imaging with normal direction of blood flow towards the liver. Other: No right upper quadrant ascites/free fluid. IMPRESSION: 1. Sludge and stones in the gallbladder. No sonographic findings of acute cholecystitis. 2. Common bile duct at 6-7 mm, borderline dilated. No choledocholithiasis in the  visualized portion. Recommend correlation with LFTs. If LFTs are elevated, recommend further evaluation with MRCP. 3. Borderline mild hepatic steatosis. Electronically Signed   By: Keith Rake M.D.   On: 09/13/2019 15:56    Assessment & Plan:   Problem List Items Addressed This Visit      Unprioritized   RESOLVED: Abnormal serum iron level    Iron sat was 45% last year. WNL      Acquired cognitive dysfunction    She has not had neurologic follow up since adolescence base on mother's preference .  She was educated in the public school system in the Special Ed classes and finished high school.  She continues to attend classes for special disability at [employer]. Her mother notes that she has been more forgetful about her medications, requiring parental supervision,  But remains able ro use her cell phone.        Eating disorder    Because of her learning disability she is unable to control her eating and gained 40 lbs in the last year. She has lost 15 lbs using phentermine in the last 3 months,  Dose is reduced  to 1/2 tablet and mother is advised to check BP at home .  If BP is elevated on 1/2 tablet daily, we discussed trial of ozempic       Elevated blood-pressure reading without diagnosis of hypertension    CONFIRMED ON REPEAT CHECK.  MAY BE MEDICATION SIDE EFFECT VS ANXIETY.  DOES NOT USE NSAIDS . Mother will check BP at home after reducing dose to 1/2 tablet daily of phentermine  And if ti remains elevated  Will need to sop the medication        Long-term current use of high risk medication other than anticoagulant - Primary   Relevant Orders   Comprehensive metabolic panel   Obesity (BMI 30.0-34.9)   Relevant Orders   TSH   Hemoglobin A1c   Lipid panel    Other Visit Diagnoses    Simple cheilitis glandularis       Relevant Orders   Vitamin B1   Vitamin B12   Sedimentation rate   CBC with Differential/Platelet      I am having Illene Labrador start on triamcinolone. I am  also having her maintain her multivitamin, Multiple Vitamins-Minerals (HAIR VITAMINS PO), fexofenadine, citalopram, hydrOXYzine, Norgestimate-Ethinyl Estradiol Triphasic, omeprazole, phentermine, fluticasone, and azelastine.  Meds ordered this encounter  Medications  . triamcinolone (KENALOG) 0.1 %    Sig: Apply 1 application topically 2 (two) times daily.    Dispense:  30 g    Refill:  0    There are no discontinued medications.  Follow-up: Return in about 4 weeks (around 04/24/2020).   Crecencio Mc, MD

## 2020-03-27 NOTE — Assessment & Plan Note (Signed)
Iron sat was 45% last year. WNL

## 2020-03-27 NOTE — Patient Instructions (Signed)
Ashlee Graves's blood pressure is very high today  Please measure it at home.  If the home reading is between 120 and 130 /70 to 80,  We are okay with reducing the phentermine to 1/2 tablet daily.  If it is higher than 130/80 ,  Suspend phentermine for 48 hours and recheck and let me know the readings   We have an alternative medication that will not raise blood pressure  That we can try    Use only vaseline or Burt's Bees /Chap stick on lips.   Triamcinolone ointment twice daily on the elbow as needed for itching  SKIN IS VERY DRY ON ELBOW.  USE AVEENO,  CUREL,  EUCERIN.  NO FRAGRANT MOISTURIZERS!

## 2020-03-29 NOTE — Assessment & Plan Note (Signed)
She has not had neurologic follow up since adolescence base on mother's preference .  She was educated in the public school system in the Special Ed classes and finished high school.  She continues to attend classes for special disability at [employer]. Her mother notes that she has been more forgetful about her medications, requiring parental supervision,  But remains able ro use her cell phone.   

## 2020-03-29 NOTE — Assessment & Plan Note (Signed)
Because of her learning disability she is unable to control her eating and gained 40 lbs in the last year. She has lost 15 lbs using phentermine in the last 3 months,  Dose is reduced to 1/2 tablet and mother is advised to check BP at home .  If BP is elevated on 1/2 tablet daily, we discussed trial of ozempic

## 2020-03-30 DIAGNOSIS — Z0189 Encounter for other specified special examinations: Secondary | ICD-10-CM | POA: Diagnosis not present

## 2020-04-06 ENCOUNTER — Other Ambulatory Visit: Payer: Self-pay

## 2020-04-06 ENCOUNTER — Ambulatory Visit (INDEPENDENT_AMBULATORY_CARE_PROVIDER_SITE_OTHER): Payer: BC Managed Care – PPO | Admitting: Dermatology

## 2020-04-06 DIAGNOSIS — D225 Melanocytic nevi of trunk: Secondary | ICD-10-CM | POA: Diagnosis not present

## 2020-04-06 DIAGNOSIS — D2272 Melanocytic nevi of left lower limb, including hip: Secondary | ICD-10-CM

## 2020-04-06 DIAGNOSIS — Z1283 Encounter for screening for malignant neoplasm of skin: Secondary | ICD-10-CM | POA: Diagnosis not present

## 2020-04-06 DIAGNOSIS — L219 Seborrheic dermatitis, unspecified: Secondary | ICD-10-CM | POA: Diagnosis not present

## 2020-04-06 DIAGNOSIS — D1800 Hemangioma unspecified site: Secondary | ICD-10-CM

## 2020-04-06 DIAGNOSIS — D229 Melanocytic nevi, unspecified: Secondary | ICD-10-CM

## 2020-04-06 DIAGNOSIS — L811 Chloasma: Secondary | ICD-10-CM

## 2020-04-06 DIAGNOSIS — L578 Other skin changes due to chronic exposure to nonionizing radiation: Secondary | ICD-10-CM | POA: Diagnosis not present

## 2020-04-06 DIAGNOSIS — L814 Other melanin hyperpigmentation: Secondary | ICD-10-CM

## 2020-04-06 DIAGNOSIS — L821 Other seborrheic keratosis: Secondary | ICD-10-CM | POA: Diagnosis not present

## 2020-04-06 MED ORDER — HYDROQUINONE 4 % EX CREA: TOPICAL_CREAM | Freq: Two times a day (BID) | CUTANEOUS | 2 refills | Status: DC

## 2020-04-06 NOTE — Patient Instructions (Addendum)
Melanoma ABCDEs  Melanoma is the most dangerous type of skin cancer, and is the leading cause of death from skin disease.  You are more likely to develop melanoma if you:  Have light-colored skin, light-colored eyes, or red or blond hair  Spend a lot of time in the sun  Tan regularly, either outdoors or in a tanning bed  Have had blistering sunburns, especially during childhood  Have a close family member who has had a melanoma  Have atypical moles or large birthmarks  Early detection of melanoma is key since treatment is typically straightforward and cure rates are extremely high if we catch it early.   The first sign of melanoma is often a change in a mole or a new dark spot.  The ABCDE system is a way of remembering the signs of melanoma.  A for asymmetry:  The two halves do not match. B for border:  The edges of the growth are irregular. C for color:  A mixture of colors are present instead of an even brown color. D for diameter:  Melanomas are usually (but not always) greater than 60mm - the size of a pencil eraser. E for evolution:  The spot keeps changing in size, shape, and color.  Please check your skin once per month between visits. You can use a small mirror in front and a large mirror behind you to keep an eye on the back side or your body.   If you see any new or changing lesions before your next follow-up, please call to schedule a visit.  Please continue daily skin protection including broad spectrum sunscreen SPF 30+ to sun-exposed areas, reapplying every 2 hours as needed when you're outdoors.    For face - Start hydroquinone 4% twice daily to dark patches at face. Sunscreen daily.

## 2020-04-06 NOTE — Progress Notes (Signed)
   Follow-Up Visit   Subjective  Ashlee Graves is a 35 y.o. female who presents for the following: TBSE (Patient here for full body skin exam and skin cancer screening. ). Has a lot of moles to check.  Also h/o melasma and has used fade cream that she bought here.  Patient accompanied by mother.   The following portions of the chart were reviewed this encounter and updated as appropriate:       Review of Systems:  No other skin or systemic complaints except as noted in HPI or Assessment and Plan.  Objective  Well appearing patient in no apparent distress; mood and affect are within normal limits.  A full examination was performed including scalp, head, eyes, ears, nose, lips, neck, chest, axillae, abdomen, back, buttocks, bilateral upper extremities, bilateral lower extremities, hands, feet, fingers, toes, fingernails, and toenails. All findings within normal limits unless otherwise noted below.  Objective  bilateral cheeks: Faint hyperpigmented patches bilateral cheeks  Objective  Left Lower pretibial: 7.0 x 6.77mm speckled brown papule within scar  Right Lower Back: Brown macules within scar, recurrent nevus  Left Spinal Mid Back: 5.0x 3.74mm med brown macule with darker center, recurrent nevus  Left Spinal Lower Back: 50mm med brown macule with notch  Right Abdomen: 5.0 x 3.60mm med brown macule with notch  Images                          Objective  Scalp: Mild scaling   Assessment & Plan  Melasma bilateral cheeks  Some improvement.  Chronic condition- tends to darken in summer. restart hydroquinone 4% twice daily to dark patches at face. Take break after 2-3 mos of use.  Sunscreen daily spf 30 or higher.   Ordered Medications: hydroquinone 4 % cream  Nevus (5) Left Lower pretibial; Right Abdomen; Left Spinal Lower Back; Right Lower Back; Left Spinal Mid Back  Benign-appearing.  Observation.  Call clinic for new or changing moles.   Recommend daily use of broad spectrum spf 30+ sunscreen to sun-exposed areas.    Seborrheic dermatitis Scalp  Chronic condition. Controlled Continue OTC Dove dermacare dandruff shampoo     Lentigines - Scattered tan macules - Due to sun exposure - Benign-appering, observe - Recommend daily broad spectrum sunscreen SPF 30+ to sun-exposed areas, reapply every 2 hours as needed. - Call for any changes  Seborrheic Keratoses - Stuck-on, waxy, tan-brown papules and plaques central upper abdomen, left chest - Discussed benign etiology and prognosis. - Observe - Call for any changes  Melanocytic Nevi - Tan-brown and/or pink-flesh-colored symmetric macules and papules at back - Benign appearing on exam today - Observation - Call clinic for new or changing moles - Recommend daily use of broad spectrum spf 30+ sunscreen to sun-exposed areas.   Hemangiomas - Red papules - Discussed benign nature - Observe - Call for any changes  Actinic Damage - Chronic, secondary to cumulative UV/sun exposure - diffuse scaly erythematous macules with underlying dyspigmentation - Recommend daily broad spectrum sunscreen SPF 30+ to sun-exposed areas, reapply every 2 hours as needed.  - Call for new or changing lesions.  Skin cancer screening performed today.  Return in about 1 year (around 04/06/2021) for TBSE.  Graciella Belton, RMA, am acting as scribe for Brendolyn Patty, MD .  Documentation: I have reviewed the above documentation for accuracy and completeness, and I agree with the above.  Brendolyn Patty MD

## 2020-04-12 ENCOUNTER — Telehealth: Payer: Self-pay | Admitting: Internal Medicine

## 2020-04-12 NOTE — Telephone Encounter (Signed)
Labs reviewed and normal.  I have abstracted them/

## 2020-04-15 ENCOUNTER — Telehealth: Payer: Self-pay | Admitting: Internal Medicine

## 2020-04-15 ENCOUNTER — Other Ambulatory Visit: Payer: Self-pay | Admitting: Internal Medicine

## 2020-04-15 ENCOUNTER — Encounter: Payer: Self-pay | Admitting: Internal Medicine

## 2020-04-15 MED ORDER — TELMISARTAN 40 MG PO TABS: 40.0000 mg | ORAL_TABLET | Freq: Every day | ORAL | 1 refills | Status: DC

## 2020-04-20 ENCOUNTER — Telehealth: Payer: Self-pay | Admitting: Internal Medicine

## 2020-04-20 NOTE — Telephone Encounter (Signed)
Vitamin B1 ( thiamine) 136.5  Sedrate  35   Lab placed in Lab folder.

## 2020-04-24 ENCOUNTER — Ambulatory Visit: Payer: Medicare Other | Admitting: Internal Medicine

## 2020-04-24 ENCOUNTER — Telehealth: Payer: Self-pay | Admitting: Internal Medicine

## 2020-04-24 NOTE — Telephone Encounter (Signed)
Left message for patient to call back and schedule Medicare Annual Wellness Visit (AWV)   This should be a telephone visit only=30 minutes.  Last AWV 08/14/15; please schedule at anytime with Denisa O'Brien-Blaney at [facility].

## 2020-04-28 DIAGNOSIS — Z0189 Encounter for other specified special examinations: Secondary | ICD-10-CM | POA: Diagnosis not present

## 2020-05-13 ENCOUNTER — Ambulatory Visit (INDEPENDENT_AMBULATORY_CARE_PROVIDER_SITE_OTHER): Payer: BC Managed Care – PPO | Admitting: Internal Medicine

## 2020-05-13 ENCOUNTER — Other Ambulatory Visit: Payer: Self-pay

## 2020-05-13 ENCOUNTER — Encounter: Payer: Self-pay | Admitting: Internal Medicine

## 2020-05-13 DIAGNOSIS — I10 Essential (primary) hypertension: Secondary | ICD-10-CM

## 2020-05-13 DIAGNOSIS — E669 Obesity, unspecified: Secondary | ICD-10-CM | POA: Diagnosis not present

## 2020-05-13 DIAGNOSIS — F5089 Other specified eating disorder: Secondary | ICD-10-CM | POA: Diagnosis not present

## 2020-05-13 MED ORDER — SEMAGLUTIDE-WEIGHT MANAGEMENT 1.7 MG/0.75ML ~~LOC~~ SOAJ
1.7000 mg | SUBCUTANEOUS | 0 refills | Status: DC
Start: 2020-08-08 — End: 2020-08-28

## 2020-05-13 MED ORDER — SEMAGLUTIDE-WEIGHT MANAGEMENT 0.5 MG/0.5ML ~~LOC~~ SOAJ: 0.5000 mg | SUBCUTANEOUS | 0 refills | Status: AC

## 2020-05-13 MED ORDER — SEMAGLUTIDE-WEIGHT MANAGEMENT 0.25 MG/0.5ML ~~LOC~~ SOAJ: 0.2500 mg | SUBCUTANEOUS | 0 refills | Status: AC

## 2020-05-13 MED ORDER — SEMAGLUTIDE-WEIGHT MANAGEMENT 1 MG/0.5ML ~~LOC~~ SOAJ: 1.0000 mg | SUBCUTANEOUS | 0 refills | Status: AC

## 2020-05-13 MED ORDER — SEMAGLUTIDE-WEIGHT MANAGEMENT 2.4 MG/0.75ML ~~LOC~~ SOAJ
2.4000 mg | SUBCUTANEOUS | 5 refills | Status: DC
Start: 2020-09-06 — End: 2020-08-28

## 2020-05-13 MED ORDER — PHENTERMINE HCL 37.5 MG PO TABS: 37.5000 mg | ORAL_TABLET | Freq: Every day | ORAL | 2 refills | Status: DC

## 2020-05-13 NOTE — Patient Instructions (Addendum)
Ashlee Graves's BP is under good control on telmisartan   So continue this medication   Resume the phentermine  And monitor her blood pressure  If it raises it above 140/90,  Let me know and we will try to get [facility] covered

## 2020-05-13 NOTE — Progress Notes (Signed)
Subjective:  Patient ID: Ashlee Graves, female    DOB: [birth year]  Age: 35 y.o. MRN: [id number]  CC: Diagnoses of Obesity (BMI 30.0-34.9), Other disorder of eating, and Essential hypertension were pertinent to this visit.  HPI Ashlee Graves presents for follow up on obesity management and new onset hypertension.  [name] is accompanied by her mother per usual.  This visit occurred during the SARS-CoV-2 public health emergency.  Safety protocols were in place, including screening questions prior to the visit, additional usage of staff PPE, and extensive cleaning of exam room while observing appropriate contact time as indicated for disinfecting solutions.   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  She has been unable to maintain a healthy weight without use of an appetite suppressant, namely phentermine , due to the lack of  Insurance coverage of alternatives thus far.  Recently she was noted to be hypertensive and the medication was suspended to determine if the hypertension was medication induced.    Her blood pressure remained elevated during the suspensions and has responded well to telmisartan.   Obesity: During the month of phentermine suspension she has gained 6 lbs.  Her parents both work full time so her eating cannot be supervised 24/7.  Her mother requests that the phentermine be resumed .  We discussed alternative therapies if the blood pressure became elevated during phentermine use,  As well as the need for a long term solution more safe than phentermine, namely  A GLP 1  Receptor agonist (WEgovy, Rybelsus, Ozempic)  Outpatient Medications Prior to Visit  Medication Sig Dispense Refill  . azelastine (OPTIVAR) 0.05 % ophthalmic solution INSTILL 1 DROP INTO BOTH EYES TWICE A DAY 18 mL 4  . citalopram (CELEXA) 10 MG tablet TAKE 1 TABLET BY MOUTH EVERY DAY 90 tablet 1  . fluticasone (FLONASE) 50 MCG/ACT nasal  spray SPRAY 2 SPRAYS INTO EACH NOSTRIL EVERY DAY 48 mL 3  . hydroquinone 4 % cream Apply topically 2 (two) times daily. To dark patches at face. 28.35 g 2  . hydrOXYzine (ATARAX/VISTARIL) 25 MG tablet TAKE 1 TABLET (25 MG TOTAL) BY MOUTH 3 (THREE) TIMES DAILY AS NEEDED FOR ITCHING. 270 tablet 1  . Multiple Vitamin (MULTIVITAMIN) capsule Take 1 capsule by mouth daily.    . Multiple Vitamins-Minerals (HAIR VITAMINS PO) Take by mouth daily.     . Norgestimate-Ethinyl Estradiol Triphasic (TRI-ESTARYLLA) 0.18/0.215/0.25 MG-35 MCG tablet Take 1 tablet by mouth daily. 84 tablet 3  . omeprazole (PRILOSEC) 40 MG capsule Take 1 capsule (40 mg total) by mouth daily. 90 capsule 1  . telmisartan (MICARDIS) 40 MG tablet Take 1 tablet (40 mg total) by mouth at bedtime. 90 tablet 1  . triamcinolone (KENALOG) 0.1 % Apply 1 application topically 2 (two) times daily. 30 g 0  . fexofenadine (ALLEGRA) 180 MG tablet Take 1 tablet (180 mg total) by mouth 2 (two) times daily as needed for allergies or rhinitis. (Patient not taking: Reported on 05/13/2020) 60 tablet 11   No facility-administered medications prior to visit.    Review of Systems;  Patient denies headache, fevers, malaise, unintentional weight loss, skin rash, eye pain, sinus congestion and sinus pain, sore throat, dysphagia,  hemoptysis , cough, dyspnea, wheezing, chest pain, palpitations, orthopnea, edema, abdominal pain, nausea, melena, diarrhea, constipation, flank pain, dysuria, hematuria, urinary  Frequency, nocturia, numbness, tingling, seizures,  Focal weakness, Loss of consciousness,  Tremor, insomnia, depression, anxiety,  and suicidal ideation.      Objective:  BP 122/74 (BP Location: Left Arm, Patient Position: Sitting, Cuff Size: Large)   Pulse 64   Temp 98 F (36.7 C) (Oral)   Resp 15   Ht 5\' 3"  (1.6 m)   Wt 171 lb 12.8 oz (77.9 kg)   SpO2 99%   BMI 30.43 kg/m   BP Readings from Last 3 Encounters:  05/13/20 122/74  03/27/20 (!)  150/82  12/13/19 (!) 130/92    Wt Readings from Last 3 Encounters:  05/13/20 171 lb 12.8 oz (77.9 kg)  03/27/20 165 lb 6 oz (75 kg)  12/13/19 180 lb 9.6 oz (81.9 kg)    General appearance: alert, cooperative and appears stated age Ears: normal TM's and external ear canals both ears Throat: lips, mucosa, and tongue normal; teeth and gums normal Neck: no adenopathy, no carotid bruit, supple, symmetrical, trachea midline and thyroid not enlarged, symmetric, no tenderness/mass/nodules Back: symmetric, no curvature. ROM normal. No CVA tenderness. Lungs: clear to auscultation bilaterally Heart: regular rate and rhythm, S1, S2 normal, no murmur, click, rub or gallop Abdomen: soft, non-tender; bowel sounds normal; no masses,  no organomegaly Pulses: 2+ and symmetric Skin: Skin color, texture, turgor normal. No rashes or lesions Lymph nodes: Cervical, supraclavicular, and axillary nodes normal.  Lab Results  Component Value Date   HGBA1C 5.1 03/27/2020   HGBA1C 5.2 05/21/2019   HGBA1C 5.1 08/25/2016    Lab Results  Component Value Date   CREATININE 0.7 03/27/2020   CREATININE 0.7 09/13/2019   CREATININE 0.7 05/20/2019    Lab Results  Component Value Date   WBC 7.9 03/27/2020   HGB 13.6 03/27/2020   HCT 45 05/20/2019   PLT 328 03/27/2020   CHOL 201 (A) 03/27/2020   TRIG 153 03/27/2020   HDL 53 03/27/2020   LDLCALC 121 03/27/2020   ALT 25 03/27/2020   AST 22 03/27/2020   NA 137 03/27/2020   K 4.4 03/27/2020   CL 102 03/27/2020   CREATININE 0.7 03/27/2020   BUN 8 03/27/2020   CO2 22 03/27/2020   TSH 1.70 03/27/2020   HGBA1C 5.1 03/27/2020    US Abdomen Limited RUQ  Result Date: 09/13/2019 CLINICAL DATA:  Recurrent vomiting and right upper quadrant pain. EXAM: ULTRASOUND ABDOMEN LIMITED RIGHT UPPER QUADRANT COMPARISON:  None. FINDINGS: Gallbladder: No filled with sludge and shadowing stones. Physiologically distended. There is no wall thickening or pericholecystic  fluid. No sonographic Murphy sign noted by sonographer. Common bile duct: Diameter: 6-7 mm. Distal portion is obscured by overlying bowel gas. No choledocholithiasis in the visualized portion. Liver: No focal lesion identified. Heterogeneous and mildly increased in parenchymal echogenicity. Portal vein is patent on color Doppler imaging with normal direction of blood flow towards the liver. Other: No right upper quadrant ascites/free fluid. IMPRESSION: 1. Sludge and stones in the gallbladder. No sonographic findings of acute cholecystitis. 2. Common bile duct at 6-7 mm, borderline dilated. No choledocholithiasis in the visualized portion. Recommend correlation with LFTs. If LFTs are elevated, recommend further evaluation with MRCP. 3. Borderline mild hepatic steatosis. Electronically Signed   By: Keith Rake M.D.   On: 09/13/2019 15:56    Assessment & Plan:   Problem List Items Addressed This Visit      Unprioritized   Obesity (BMI 30.0-34.9)    She will Resume phentermine with close attention to BP  And notify me if BP becomes elevated.  RTC 3 months  Essential hypertension    She is tolerating telmisartan 40mg  daily,  BP is at goal and repeat BMET was normal.   Lab Results  Component Value Date   CREATININE 0.7 03/27/2020   Lab Results  Component Value Date   NA 137 03/27/2020   K 4.4 03/27/2020   CL 102 03/27/2020   CO2 22 03/27/2020         Eating disorder    Because of her learning disability she is unable to control her compulsive  eating and gained 40 lbs in the last year.  Resume phentermine for now          I am having Illene Labrador start on phentermine, Semaglutide-Weight Management, Semaglutide-Weight Management, Semaglutide-Weight Management, Semaglutide-Weight Management, and Semaglutide-Weight Management. I am also having her maintain her multivitamin, Multiple Vitamins-Minerals (HAIR VITAMINS PO), fexofenadine, citalopram, hydrOXYzine, Norgestimate-Ethinyl  Estradiol Triphasic, omeprazole, fluticasone, azelastine, triamcinolone cream, hydroquinone, and telmisartan.  Meds ordered this encounter  Medications  . phentermine (ADIPEX-P) 37.5 MG tablet    Sig: Take 1 tablet (37.5 mg total) by mouth daily before breakfast.    Dispense:  30 tablet    Refill:  2  . Semaglutide-Weight Management 0.25 MG/0.5ML SOAJ    Sig: Inject 0.25 mg into the skin once a week for 28 days.    Dispense:  2 mL    Refill:  0  . Semaglutide-Weight Management 0.5 MG/0.5ML SOAJ    Sig: Inject 0.5 mg into the skin once a week for 28 days.    Dispense:  2 mL    Refill:  0  . Semaglutide-Weight Management 1 MG/0.5ML SOAJ    Sig: Inject 1 mg into the skin once a week for 28 days.    Dispense:  2 mL    Refill:  0  . Semaglutide-Weight Management 1.7 MG/0.75ML SOAJ    Sig: Inject 1.7 mg into the skin once a week for 28 days.    Dispense:  3 mL    Refill:  0  . Semaglutide-Weight Management 2.4 MG/0.75ML SOAJ    Sig: Inject 2.4 mg into the skin once a week for 28 days.    Dispense:  3 mL    Refill:  5    There are no discontinued medications.  Follow-up: Return in about 3 months (around 08/12/2020).   Crecencio Mc, MD

## 2020-05-15 NOTE — Assessment & Plan Note (Signed)
Because of her learning disability she is unable to control her compulsive  eating and gained 40 lbs in the last year.  Resume phentermine for now

## 2020-05-15 NOTE — Assessment & Plan Note (Addendum)
She is tolerating telmisartan 40mg  daily,  BP is at goal and repeat BMET was normal.   Lab Results  Component Value Date   CREATININE 0.7 03/27/2020   Lab Results  Component Value Date   NA 137 03/27/2020   K 4.4 03/27/2020   CL 102 03/27/2020   CO2 22 03/27/2020

## 2020-05-15 NOTE — Assessment & Plan Note (Signed)
She will Resume phentermine with close attention to BP  And notify me if BP becomes elevated.  RTC 3 months

## 2020-06-18 ENCOUNTER — Other Ambulatory Visit: Payer: Self-pay | Admitting: Internal Medicine

## 2020-07-03 ENCOUNTER — Other Ambulatory Visit: Payer: Self-pay | Admitting: Internal Medicine

## 2020-07-04 ENCOUNTER — Other Ambulatory Visit: Payer: Self-pay | Admitting: Internal Medicine

## 2020-07-20 ENCOUNTER — Telehealth: Payer: Self-pay | Admitting: Internal Medicine

## 2020-07-20 NOTE — Telephone Encounter (Signed)
Left message for patient to call back and schedule Medicare Annual Wellness Visit (AWV) in office.   If not able to come in office, please offer to do virtually or by telephone.   Last AWV:08/14/2015  Please schedule at anytime with Nurse Health Advisor.

## 2020-08-02 ENCOUNTER — Other Ambulatory Visit: Payer: Self-pay | Admitting: Internal Medicine

## 2020-08-24 ENCOUNTER — Ambulatory Visit: Payer: Medicare Other | Admitting: Internal Medicine

## 2020-08-27 ENCOUNTER — Telehealth: Payer: Self-pay | Admitting: Internal Medicine

## 2020-08-27 NOTE — Telephone Encounter (Signed)
Left message for patient to call back and schedule Medicare Annual Wellness Visit (AWV) in office.   If not able to come in office, please offer to do virtually or by telephone.   Last AWV:08/14/2015  Please schedule at anytime with Nurse Health Advisor.

## 2020-08-28 ENCOUNTER — Ambulatory Visit (INDEPENDENT_AMBULATORY_CARE_PROVIDER_SITE_OTHER): Payer: BC Managed Care – PPO | Admitting: Internal Medicine

## 2020-08-28 ENCOUNTER — Other Ambulatory Visit: Payer: Self-pay

## 2020-08-28 VITALS — BP 118/84 | HR 75 | Temp 97.0°F | Ht 63.0 in | Wt 162.4 lb

## 2020-08-28 DIAGNOSIS — F509 Eating disorder, unspecified: Secondary | ICD-10-CM | POA: Diagnosis not present

## 2020-08-28 DIAGNOSIS — E669 Obesity, unspecified: Secondary | ICD-10-CM

## 2020-08-28 DIAGNOSIS — I10 Essential (primary) hypertension: Secondary | ICD-10-CM

## 2020-08-28 NOTE — Progress Notes (Signed)
Pre visit review using our clinic review tool, if applicable. No additional management support is needed unless otherwise documented below in the visit note. 

## 2020-08-28 NOTE — Patient Instructions (Signed)
Please ask the nurse to draw the following labs:  A1c:  to scree for diabetes/prediabetes  Microalb/cr ratio   (requires a urine specimen) : for evaluation of hypertension' effect on the kidney   Lipid panel (a 4 hour fast is required)  We will continue phentermine for 3 more months before attempting to switch to Wegovy/Ozempic    Rena YOU PROMISED ME YOU WOULD WALK FOR [place]

## 2020-08-28 NOTE — Progress Notes (Signed)
Subjective:  Patient ID: Ashlee Graves, female    DOB: [birth year]  Age: 35 y.o. MRN: [id number]  CC: The primary encounter diagnosis was Obesity (BMI 30.0-34.9). Diagnoses of Essential hypertension and Eating disorder, unspecified type were also pertinent to this visit.  HPI Ashlee Graves presents for 3 month follow up on obesity secondary to eating disorder complicated by cognitive dysfunction  This visit occurred during the SARS-CoV-2 public health emergency.  Safety protocols were in place, including screening questions prior to the visit, additional usage of staff PPE, and extensive cleaning of exam room while observing appropriate contact time as indicated for disinfecting solutions.   Ashlee Graves is a 35 yr old female with cognitive impairment complicated by an eating disorder who is accompanied by her mother for follow up repeat treatment of obesity with phentermine .  Patient has resumed taking phentermine since last visit in April, per mother's request, as family has been unable to supervise her eating habits  and restrict her intake.  Her ideal weight is 140 lbs (for BMI < 25) and without medication she has reached 185 lbs .  She has lost 9 lbs since April visit (5%) .  She is not exercising regularly  Outpatient Medications Prior to Visit  Medication Sig Dispense Refill   azelastine (OPTIVAR) 0.05 % ophthalmic solution INSTILL 1 DROP INTO BOTH EYES TWICE A DAY 18 mL 4   citalopram (CELEXA) 10 MG tablet TAKE 1 TABLET BY MOUTH EVERY DAY 90 tablet 1   fluticasone (FLONASE) 50 MCG/ACT nasal spray SPRAY 2 SPRAYS INTO EACH NOSTRIL EVERY DAY 48 mL 3   hydroquinone 4 % cream Apply topically 2 (two) times daily. To dark patches at face. 28.35 g 2   Multiple Vitamin (MULTIVITAMIN) capsule Take 1 capsule by mouth daily.     Multiple Vitamins-Minerals (HAIR VITAMINS PO) Take by mouth daily.      omeprazole (PRILOSEC) 40 MG capsule TAKE 1 CAPSULE BY MOUTH EVERY DAY 90 capsule 1   phentermine  (ADIPEX-P) 37.5 MG tablet Take 1 tablet (37.5 mg total) by mouth daily before breakfast. 30 tablet 2   hydrOXYzine (ATARAX/VISTARIL) 25 MG tablet TAKE 1 TABLET (25 MG TOTAL) BY MOUTH 3 (THREE) TIMES DAILY AS NEEDED FOR ITCHING. 270 tablet 1   telmisartan (MICARDIS) 40 MG tablet Take 1 tablet (40 mg total) by mouth at bedtime. 90 tablet 1   TRI-ESTARYLLA 0.18/0.215/0.25 MG-35 MCG tablet TAKE 1 TABLET BY MOUTH EVERY DAY 84 tablet 3   triamcinolone cream (KENALOG) 0.1 % APPLY TO AFFECTED AREA TWICE A DAY 30 g 0   fexofenadine (ALLEGRA) 180 MG tablet Take 1 tablet (180 mg total) by mouth 2 (two) times daily as needed for allergies or rhinitis. 60 tablet 11   Semaglutide-Weight Management 1.7 MG/0.75ML SOAJ Inject 1.7 mg into the skin once a week for 28 days. 3 mL 0   [START ON 09/06/2020] Semaglutide-Weight Management 2.4 MG/0.75ML SOAJ Inject 2.4 mg into the skin once a week for 28 days. 3 mL 5   No facility-administered medications prior to visit.    Review of Systems;  Patient denies headache, fevers, malaise, unintentional weight loss, skin rash, eye pain, sinus congestion and sinus pain, sore throat, dysphagia,  hemoptysis , cough, dyspnea, wheezing, chest pain, palpitations, orthopnea, edema, abdominal pain, nausea, melena, diarrhea, constipation, flank pain, dysuria, hematuria, urinary  Frequency, nocturia, numbness, tingling, seizures,  Focal weakness, Loss of consciousness,  Tremor, insomnia, depression, anxiety, and suicidal ideation.      Objective:  BP 118/84   Pulse 75   Temp (!) 97 F (36.1 C) (Skin)   Ht '5\' 3"'$  (1.6 m)   Wt 162 lb 6.4 oz (73.7 kg)   SpO2 99%   BMI 28.77 kg/m   BP Readings from Last 3 Encounters:  08/28/20 118/84  05/13/20 122/74  03/27/20 (!) 150/82    Wt Readings from Last 3 Encounters:  08/28/20 162 lb 6.4 oz (73.7 kg)  05/13/20 171 lb 12.8 oz (77.9 kg)  03/27/20 165 lb 6 oz (75 kg)    General appearance: alert, cooperative and appears stated  age Ears: normal TM's and external ear canals both ears Throat: lips, mucosa, and tongue normal; teeth and gums normal Neck: no adenopathy, no carotid bruit, supple, symmetrical, trachea midline and thyroid not enlarged, symmetric, no tenderness/mass/nodules Back: symmetric, no curvature. ROM normal. No CVA tenderness. Lungs: clear to auscultation bilaterally Heart: regular rate and rhythm, S1, S2 normal, no murmur, click, rub or gallop Abdomen: soft, non-tender; bowel sounds normal; no masses,  no organomegaly Pulses: 2+ and symmetric Skin: Skin color, texture, turgor normal. No rashes or lesions Lymph nodes: Cervical, supraclavicular, and axillary nodes normal.  Lab Results  Component Value Date   HGBA1C 5.1 03/27/2020   HGBA1C 5.2 05/21/2019   HGBA1C 5.1 08/25/2016    Lab Results  Component Value Date   CREATININE 0.7 03/27/2020   CREATININE 0.7 09/13/2019   CREATININE 0.7 05/20/2019    Lab Results  Component Value Date   WBC 7.9 03/27/2020   HGB 13.6 03/27/2020   HCT 45 05/20/2019   PLT 328 03/27/2020   CHOL 201 (A) 03/27/2020   TRIG 153 03/27/2020   HDL 53 03/27/2020   LDLCALC 121 03/27/2020   ALT 25 03/27/2020   AST 22 03/27/2020   NA 137 03/27/2020   K 4.4 03/27/2020   CL 102 03/27/2020   CREATININE 0.7 03/27/2020   BUN 8 03/27/2020   CO2 22 03/27/2020   TSH 1.70 03/27/2020   HGBA1C 5.1 03/27/2020    US Abdomen Limited RUQ  Result Date: 09/13/2019 CLINICAL DATA:  Recurrent vomiting and right upper quadrant pain. EXAM: ULTRASOUND ABDOMEN LIMITED RIGHT UPPER QUADRANT COMPARISON:  None. FINDINGS: Gallbladder: No filled with sludge and shadowing stones. Physiologically distended. There is no wall thickening or pericholecystic fluid. No sonographic Murphy sign noted by sonographer. Common bile duct: Diameter: 6-7 mm. Distal portion is obscured by overlying bowel gas. No choledocholithiasis in the visualized portion. Liver: No focal lesion identified. Heterogeneous  and mildly increased in parenchymal echogenicity. Portal vein is patent on color Doppler imaging with normal direction of blood flow towards the liver. Other: No right upper quadrant ascites/free fluid. IMPRESSION: 1. Sludge and stones in the gallbladder. No sonographic findings of acute cholecystitis. 2. Common bile duct at 6-7 mm, borderline dilated. No choledocholithiasis in the visualized portion. Recommend correlation with LFTs. If LFTs are elevated, recommend further evaluation with MRCP. 3. Borderline mild hepatic steatosis. Electronically Signed   By: Keith Rake M.D.   On: 09/13/2019 15:56    Assessment & Plan:   Problem List Items Addressed This Visit       Unprioritized   Obesity (BMI 30.0-34.9) - Primary    She has lost 5% of her body weight since her last visit and is tolerating med without side effects.  Not exercising despite mothers stated attempts to encourage her to walk. .  Discussed with mother that she or other family members will need to try harder to  engage patient in regular exercise to promote healthy lifestyle and weight loss. Phentermine refilled for 3 more months.  Will transition to Catharine after 3 months        Relevant Orders   Hemoglobin A1c   Eating disorder    Complicated by cognitive impairment since infancy,  Resulting in obesity if left untreated.  Discussed transitioning from phentermine to Ozempic after 3 more months       Essential hypertension   Relevant Orders   Lipid panel   Microalbumin / creatinine urine ratio    I have discontinued Diamond Kukla's fexofenadine, Semaglutide-Weight Management, and Semaglutide-Weight Management. I am also having her maintain her multivitamin, Multiple Vitamins-Minerals (HAIR VITAMINS PO), fluticasone, azelastine, hydroquinone, phentermine, omeprazole, citalopram, and triamcinolone cream.  No orders of the defined types were placed in this encounter.   Medications Discontinued During This Encounter   Medication Reason   fexofenadine (ALLEGRA) 180 MG tablet Patient Preference   Semaglutide-Weight Management 1.7 MG/0.75ML SOAJ Patient Preference   Semaglutide-Weight Management 2.4 MG/0.75ML SOAJ Patient Preference    Follow-up: Return in about 3 months (around 11/28/2020).   Crecencio Mc, MD

## 2020-08-30 ENCOUNTER — Other Ambulatory Visit: Payer: Self-pay | Admitting: Internal Medicine

## 2020-08-31 ENCOUNTER — Encounter: Payer: Self-pay | Admitting: Internal Medicine

## 2020-08-31 NOTE — Assessment & Plan Note (Signed)
She has lost 5% of her body weight since her last visit and is tolerating med without side effects.  Not exercising despite mothers stated attempts to encourage her to walk. .  Discussed with mother that she or other family members will need to try harder to engage patient in regular exercise to promote healthy lifestyle and weight loss. Phentermine refilled for 3 more months.  Will transition to Ozempic after 3 months

## 2020-08-31 NOTE — Telephone Encounter (Signed)
RX Refill:phentermine Last Seen:08-28-20 Last ordered:05-13-20

## 2020-08-31 NOTE — Assessment & Plan Note (Signed)
Complicated by cognitive impairment since infancy,  Resulting in obesity if left untreated.  Discussed transitioning from phentermine to Ozempic after 3 more months

## 2020-09-02 ENCOUNTER — Other Ambulatory Visit: Payer: Self-pay | Admitting: Internal Medicine

## 2020-09-02 MED ORDER — HYDROXYZINE HCL 25 MG PO TABS: 25.0000 mg | ORAL_TABLET | Freq: Three times a day (TID) | ORAL | 1 refills | Status: DC | PRN

## 2020-09-02 NOTE — Addendum Note (Signed)
Addended by: Elpidio Galea T on: 09/02/2020 01:59 PM   Modules accepted: Orders

## 2020-09-04 DIAGNOSIS — Z0189 Encounter for other specified special examinations: Secondary | ICD-10-CM | POA: Diagnosis not present

## 2020-09-22 DIAGNOSIS — Z0189 Encounter for other specified special examinations: Secondary | ICD-10-CM | POA: Diagnosis not present

## 2020-09-23 LAB — LIPID PANEL
Cholesterol: 191 (ref 0–200)
HDL: 56 (ref 35–70)
LDL Cholesterol: 121
Triglycerides: 78 (ref 40–160)

## 2020-09-23 LAB — MICROALBUMIN, URINE: Microalb, Ur: 5

## 2020-09-23 LAB — HEMOGLOBIN A1C: Hemoglobin A1C: 5.3

## 2020-10-11 IMAGING — US US ABDOMEN LIMITED
1 series · 14 of 25 positions shown · non-contrast
Comparison: None.

CLINICAL DATA: Recurrent vomiting and right upper quadrant pain.

EXAM:
ULTRASOUND ABDOMEN LIMITED RIGHT UPPER QUADRANT

[Series 1: us abdomen limited ruq · 14 of 50 slices shown]
[im 1/50]
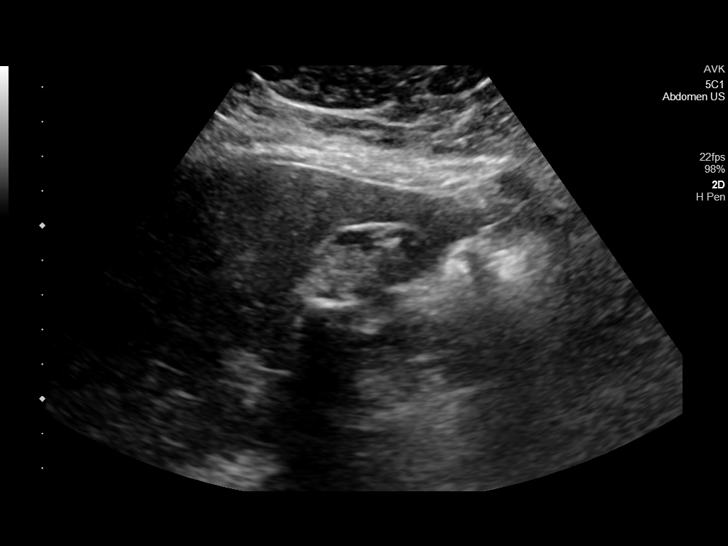
[im 5/50]
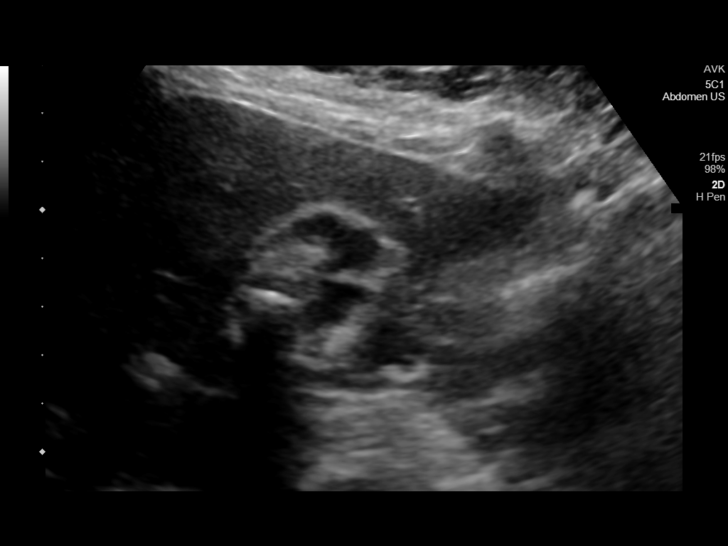
[im 9/50]
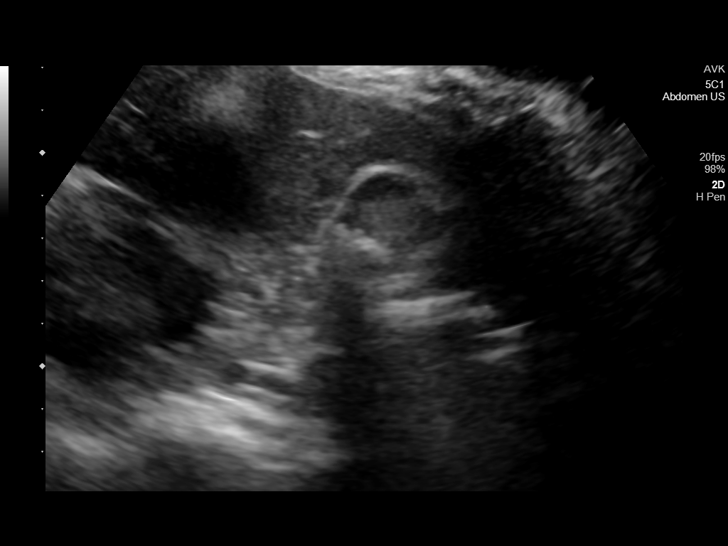
[im 13/50]
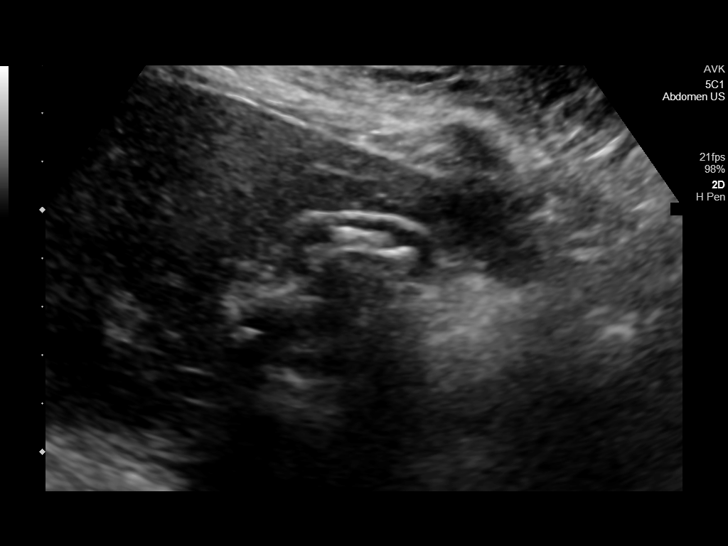
[im 17/50]
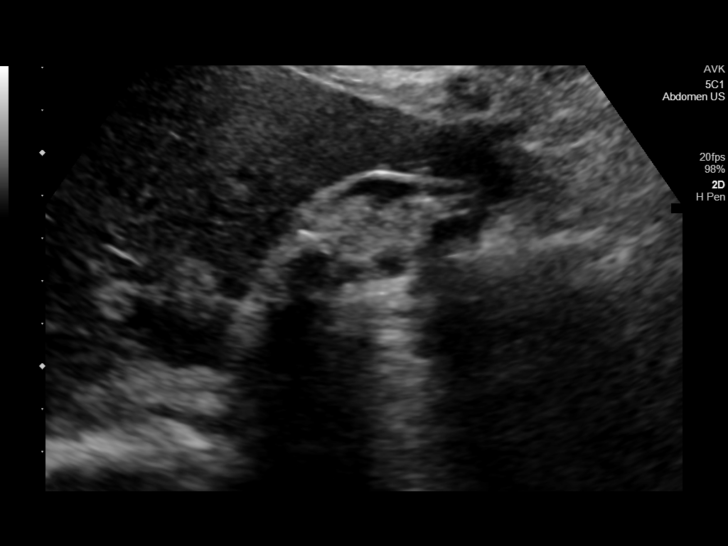
[im 19/50]
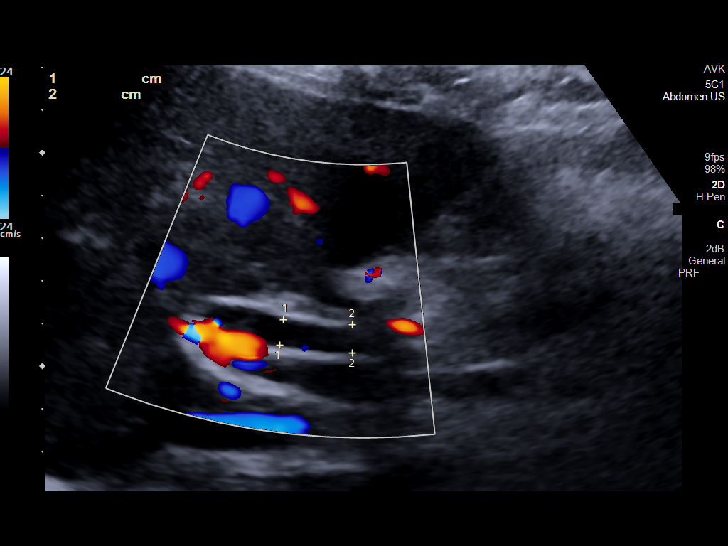
[im 23/50]
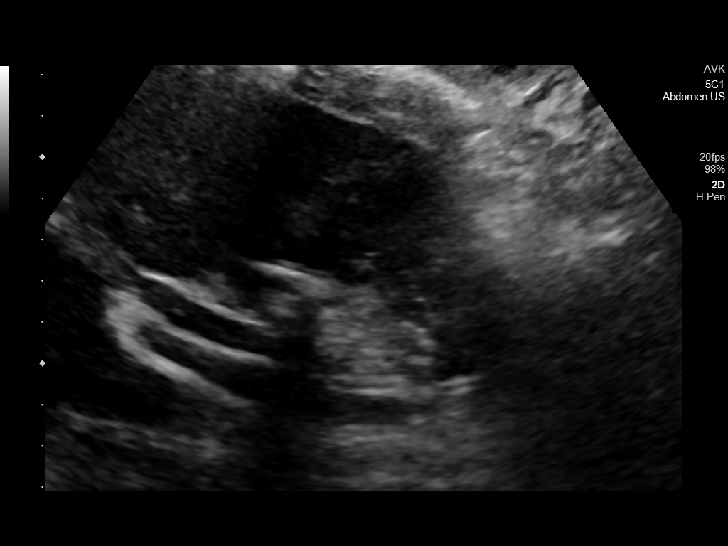
[im 27/50]
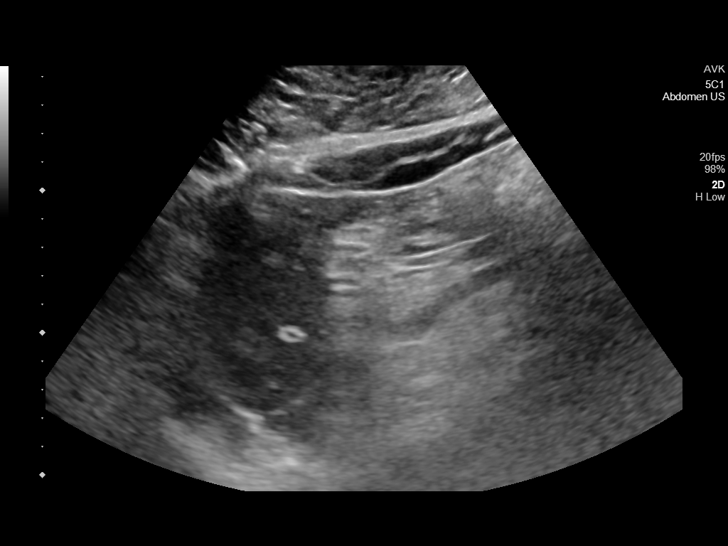
[im 31/50]
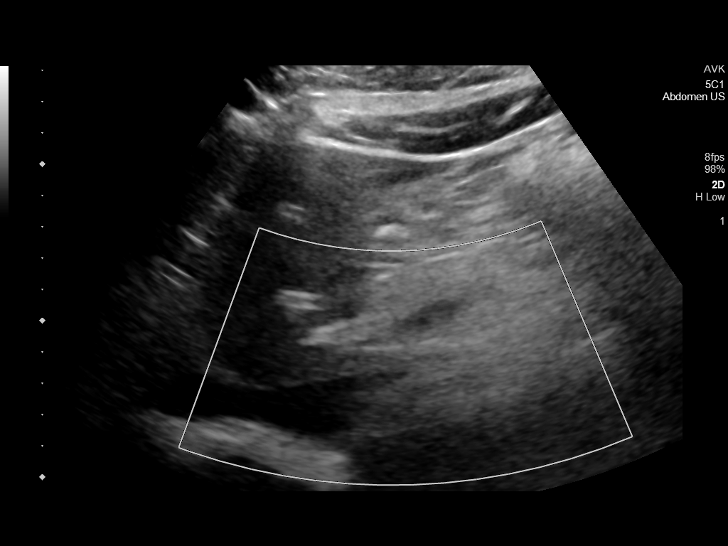
[im 33/50]
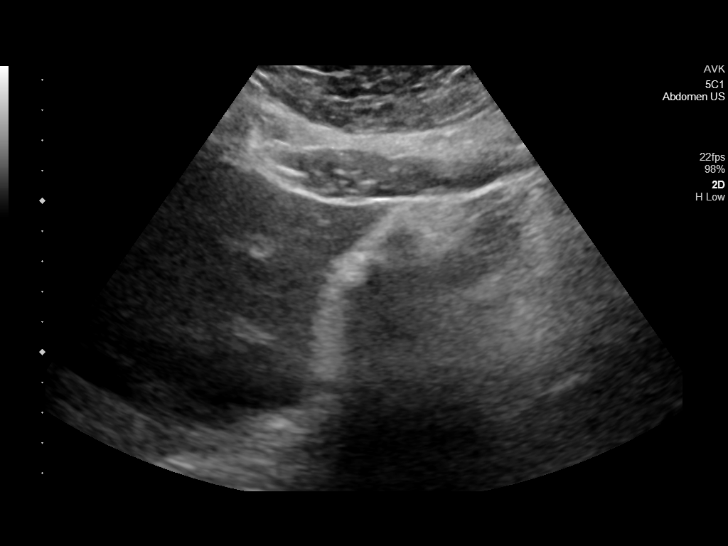
[im 37/50]
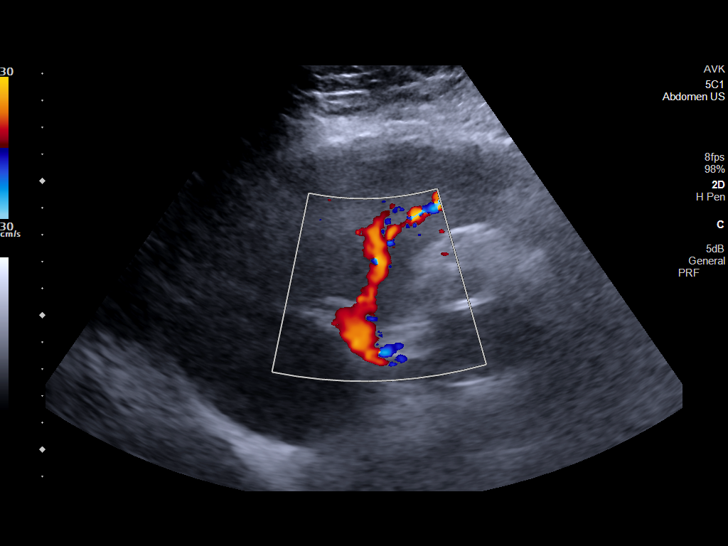
[im 41/50]
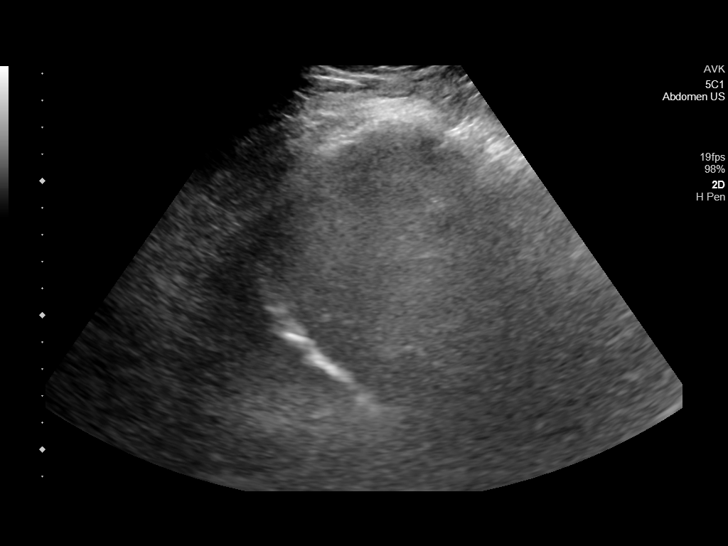
[im 45/50]
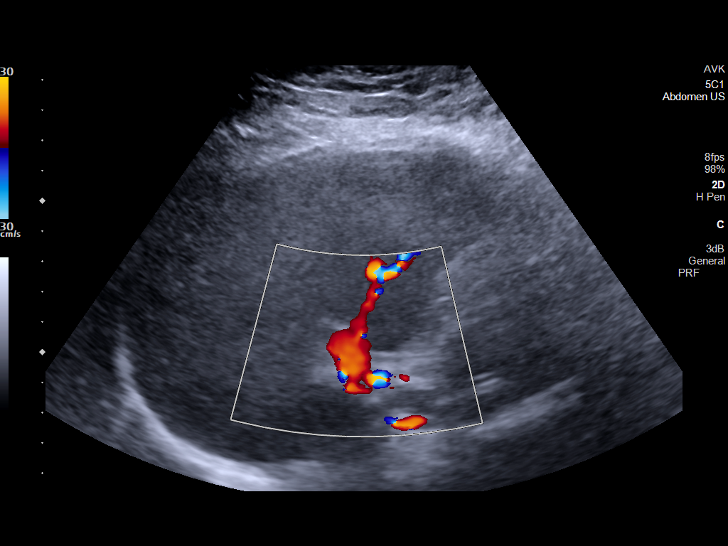
[im 50/50]
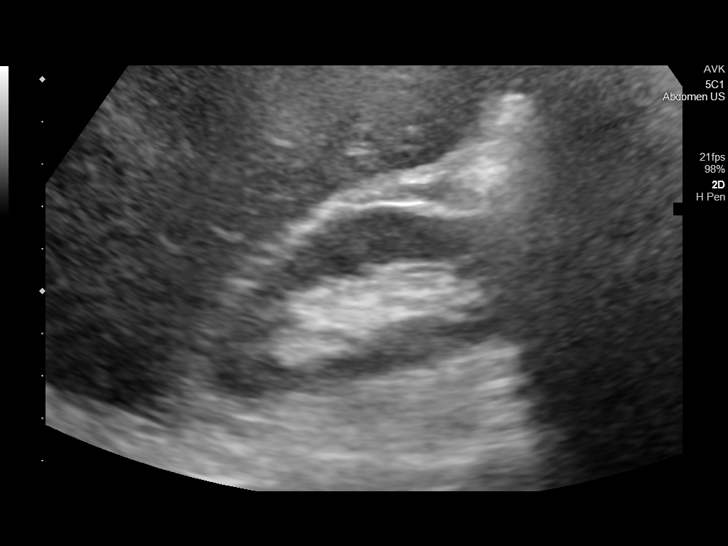

[14 of 25 positions shown; findings below may reference images not displayed]

FINDINGS: Gallbladder:

No filled with sludge and shadowing stones. Physiologically
distended. There is no wall thickening or pericholecystic fluid. No
sonographic Murphy sign noted by sonographer.

Common bile duct:

Diameter: 6-7 mm. Distal portion is obscured by overlying bowel gas.
No choledocholithiasis in the visualized portion.

Liver:

No focal lesion identified. Heterogeneous and mildly increased in
parenchymal echogenicity. Portal vein is patent on color Doppler
imaging with normal direction of blood flow towards the liver.

Other: No right upper quadrant ascites/free fluid.
IMPRESSION: 1. Sludge and stones in the gallbladder. No sonographic findings of
acute cholecystitis.
2. Common bile duct at 6-7 mm, borderline dilated. No
choledocholithiasis in the visualized portion. Recommend correlation
with LFTs. If LFTs are elevated, recommend further evaluation with
MRCP.
3. Borderline mild hepatic steatosis.

## 2020-11-30 ENCOUNTER — Other Ambulatory Visit: Payer: Self-pay | Admitting: Internal Medicine

## 2020-11-30 NOTE — Telephone Encounter (Signed)
RX Refill: phentermine Last Seen: 08-28-20 Last Ordered: 09-01-20 Next Appt: 12-04-20

## 2020-12-04 ENCOUNTER — Encounter: Payer: Self-pay | Admitting: Internal Medicine

## 2020-12-04 ENCOUNTER — Other Ambulatory Visit: Payer: Self-pay

## 2020-12-04 ENCOUNTER — Ambulatory Visit (INDEPENDENT_AMBULATORY_CARE_PROVIDER_SITE_OTHER): Payer: BC Managed Care – PPO | Admitting: Internal Medicine

## 2020-12-04 DIAGNOSIS — F418 Other specified anxiety disorders: Secondary | ICD-10-CM | POA: Diagnosis not present

## 2020-12-04 DIAGNOSIS — E669 Obesity, unspecified: Secondary | ICD-10-CM

## 2020-12-04 MED ORDER — FEXOFENADINE HCL 180 MG PO TABS: ORAL_TABLET | ORAL | 3 refills | Status: DC

## 2020-12-04 MED ORDER — NALTREXONE-BUPROPION HCL ER 8-90 MG PO TB12: ORAL_TABLET | ORAL | 0 refills | Status: DC

## 2020-12-04 NOTE — Assessment & Plan Note (Signed)
Managed with low dose celexa .  No changes today some moodiness at home discussed,  Triggered by interactions with her natural father who has had some mood lability since suffering a stroke

## 2020-12-04 NOTE — Progress Notes (Signed)
Subjective:  Patient ID: Ashlee Graves, female    DOB: 03-26-1985  Age: 35 y.o. MRN: [id number]  CC: Diagnoses of Obesity (BMI 30.0-34.9) and Depression with anxiety were pertinent to this visit.  HPI Elvin Banker presents for  Chief Complaint  Patient presents with   Follow-up    Follow up on weight management   This visit occurred during the SARS-CoV-2 public health emergency.  Safety protocols were in place, including screening questions prior to the visit, additional usage of staff PPE, and extensive cleaning of exam room while observing appropriate contact time as indicated for disinfecting solutions.   Obesity:  with eating disorder aggravated by intellectual /development  and anxiety.  She is taking phentermine  for weight management.  One tablet daily.  Not exercising, parents not enforcing daily exercise.  Hypertension:  taking telmisartan for BP ,  home readings > 130/80 when checked monthly.    Outpatient Medications Prior to Visit  Medication Sig Dispense Refill   azelastine (OPTIVAR) 0.05 % ophthalmic solution INSTILL 1 DROP INTO BOTH EYES TWICE A DAY 18 mL 4   citalopram (CELEXA) 10 MG tablet TAKE 1 TABLET BY MOUTH EVERY DAY 90 tablet 1   fluticasone (FLONASE) 50 MCG/ACT nasal spray SPRAY 2 SPRAYS INTO EACH NOSTRIL EVERY DAY 48 mL 3   hydroquinone 4 % cream Apply topically 2 (two) times daily. To dark patches at face. 28.35 g 2   hydrOXYzine (ATARAX/VISTARIL) 25 MG tablet Take 1 tablet (25 mg total) by mouth 3 (three) times daily as needed for itching. 270 tablet 1   Multiple Vitamins-Minerals (HAIR VITAMINS PO) Take by mouth daily.      omeprazole (PRILOSEC) 40 MG capsule TAKE 1 CAPSULE BY MOUTH EVERY DAY 90 capsule 1   telmisartan (MICARDIS) 40 MG tablet TAKE 1 TABLET BY MOUTH EVERYDAY AT BEDTIME 90 tablet 1   TRI-ESTARYLLA 0.18/0.215/0.25 MG-35 MCG tablet TAKE 1 TABLET BY MOUTH EVERY DAY 84 tablet 3   triamcinolone cream (KENALOG) 0.1 % APPLY TO AFFECTED AREA TWICE A  DAY 30 g 0   phentermine (ADIPEX-P) 37.5 MG tablet TAKE 1 TABLET BY MOUTH EVERY DAY BEFORE BREAKFAST 30 tablet 2   fexofenadine (ALLEGRA) 180 MG tablet TAKE 1 TABLET (180 MG TOTAL) BY MOUTH 2 (TWO) TIMES DAILY AS NEEDED FOR ALLERGIES OR RHINITIS. (Patient not taking: Reported on 12/04/2020) 180 tablet 3   Multiple Vitamin (MULTIVITAMIN) capsule Take 1 capsule by mouth daily. (Patient not taking: Reported on 12/04/2020)     No facility-administered medications prior to visit.    Review of Systems;  Patient denies headache, fevers, malaise, unintentional weight loss, skin rash, eye pain, sinus congestion and sinus pain, sore throat, dysphagia,  hemoptysis , cough, dyspnea, wheezing, chest pain, palpitations, orthopnea, edema, abdominal pain, nausea, melena, diarrhea, constipation, flank pain, dysuria, hematuria, urinary  Frequency, nocturia, numbness, tingling, seizures,  Focal weakness, Loss of consciousness,  Tremor, insomnia, depression, anxiety, and suicidal ideation.      Objective:  BP 108/72 (BP Location: Left Arm, Patient Position: Sitting, Cuff Size: Normal)   Pulse 68   Temp (!) 96.7 F (35.9 C) (Temporal)   Ht 5\' 3"  (1.6 m)   Wt 155 lb 3.2 oz (70.4 kg)   SpO2 97%   BMI 27.49 kg/m   BP Readings from Last 3 Encounters:  12/04/20 108/72  08/28/20 118/84  05/13/20 122/74    Wt Readings from Last 3 Encounters:  12/04/20 155 lb 3.2 oz (70.4 kg)  08/28/20 162 lb 6.4  oz (73.7 kg)  05/13/20 171 lb 12.8 oz (77.9 kg)    General appearance: alert, cooperative and appears stated age Ears: normal TM's and external ear canals both ears Throat: lips, mucosa, and tongue normal; teeth and gums normal Neck: no adenopathy, no carotid bruit, supple, symmetrical, trachea midline and thyroid not enlarged, symmetric, no tenderness/mass/nodules Back: symmetric, no curvature. ROM normal. No CVA tenderness. Lungs: clear to auscultation bilaterally Heart: regular rate and rhythm, S1, S2  normal, no murmur, click, rub or gallop Abdomen: soft, non-tender; bowel sounds normal; no masses,  no organomegaly Pulses: 2+ and symmetric Skin: Skin color, texture, turgor normal. No rashes or lesions Lymph nodes: Cervical, supraclavicular, and axillary nodes normal.  Lab Results  Component Value Date   HGBA1C 5.3 09/23/2020   HGBA1C 5.1 03/27/2020   HGBA1C 5.2 05/21/2019    Lab Results  Component Value Date   CREATININE 0.7 03/27/2020   CREATININE 0.7 09/13/2019   CREATININE 0.7 05/20/2019    Lab Results  Component Value Date   WBC 7.9 03/27/2020   HGB 13.6 03/27/2020   HCT 45 05/20/2019   PLT 328 03/27/2020   CHOL 191 09/23/2020   TRIG 78 09/23/2020   HDL 56 09/23/2020   LDLCALC 121 09/23/2020   ALT 25 03/27/2020   AST 22 03/27/2020   NA 137 03/27/2020   K 4.4 03/27/2020   CL 102 03/27/2020   CREATININE 0.7 03/27/2020   BUN 8 03/27/2020   CO2 22 03/27/2020   TSH 1.70 03/27/2020   HGBA1C 5.3 09/23/2020   MICROALBUR 5 09/23/2020    US Abdomen Limited RUQ  Result Date: 09/13/2019 CLINICAL DATA:  Recurrent vomiting and right upper quadrant pain. EXAM: ULTRASOUND ABDOMEN LIMITED RIGHT UPPER QUADRANT COMPARISON:  None. FINDINGS: Gallbladder: No filled with sludge and shadowing stones. Physiologically distended. There is no wall thickening or pericholecystic fluid. No sonographic Murphy sign noted by sonographer. Common bile duct: Diameter: 6-7 mm. Distal portion is obscured by overlying bowel gas. No choledocholithiasis in the visualized portion. Liver: No focal lesion identified. Heterogeneous and mildly increased in parenchymal echogenicity. Portal vein is patent on color Doppler imaging with normal direction of blood flow towards the liver. Other: No right upper quadrant ascites/free fluid. IMPRESSION: 1. Sludge and stones in the gallbladder. No sonographic findings of acute cholecystitis. 2. Common bile duct at 6-7 mm, borderline dilated. No choledocholithiasis in the  visualized portion. Recommend correlation with LFTs. If LFTs are elevated, recommend further evaluation with MRCP. 3. Borderline mild hepatic steatosis. Electronically Signed   By: Keith Rake M.D.   On: 09/13/2019 15:56    Assessment & Plan:   Problem List Items Addressed This Visit     Depression with anxiety    Managed with low dose celexa .  No changes today some moodiness at home discussed,  Triggered by interactions with her natural father who has had some mood lability since suffering a stroke       Obesity (BMI 30.0-34.9)    She has lost 5 % of body weight since last visit and has now been taking phentermine for 6 months.  She has had no adverse side effects.    Her mother is reluctant to switch her to a safer alternative but I have emphasized that this necessary for long term pharmacotherapy.  I have sent Contrave to her pharmacy ; if it is affordable, and effective we will continue this       I have discontinued Jelissa Zumstein's multivitamin and phentermine.  I am also having her start on Naltrexone-buPROPion HCl ER. Additionally, I am having her maintain her Multiple Vitamins-Minerals (HAIR VITAMINS PO), fluticasone, azelastine, hydroquinone, omeprazole, citalopram, triamcinolone cream, Tri-Estarylla, telmisartan, hydrOXYzine, and fexofenadine.  Meds ordered this encounter  Medications   fexofenadine (ALLEGRA) 180 MG tablet    Sig: TAKE 1 TABLET (180 MG TOTAL) BY MOUTH 2 (TWO) TIMES DAILY AS NEEDED FOR ALLERGIES OR RHINITIS.    Dispense:  180 tablet    Refill:  3   Naltrexone-buPROPion HCl ER 8-90 MG TB12    Sig: Start 1 tablet every morning for 7 days, then 1 tablet twice daily for 7 days, then 2 tablets every morning and one in the evening    Dispense:  120 tablet    Refill:  0    Medications Discontinued During This Encounter  Medication Reason   Multiple Vitamin (MULTIVITAMIN) capsule    fexofenadine (ALLEGRA) 180 MG tablet Reorder   phentermine (ADIPEX-P) 37.5 MG  tablet     Follow-up: Return in about 4 months (around 04/03/2021).   Crecencio Mc, MD

## 2020-12-04 NOTE — Assessment & Plan Note (Signed)
She has lost 5 % of body weight since last visit and has now been taking phentermine for 6 months.  She has had no adverse side effects.    Her mother is reluctant to switch her to a safer alternative but I have emphasized that this necessary for long term pharmacotherapy.  I have sent Contrave to her pharmacy ; if it is affordable, and effective we will continue this

## 2020-12-04 NOTE — Patient Instructions (Signed)
I'm glad Ashlee Graves is losing weight  I recommend that we continue to try alternatives to phentermine for long term suppression of appetite,  starting with Contrave

## 2020-12-05 ENCOUNTER — Other Ambulatory Visit: Payer: Self-pay | Admitting: Internal Medicine

## 2020-12-11 ENCOUNTER — Other Ambulatory Visit: Payer: Self-pay | Admitting: Internal Medicine

## 2020-12-15 DIAGNOSIS — Z23 Encounter for immunization: Secondary | ICD-10-CM | POA: Diagnosis not present

## 2020-12-23 ENCOUNTER — Other Ambulatory Visit: Payer: Self-pay | Admitting: Internal Medicine

## 2021-01-15 ENCOUNTER — Other Ambulatory Visit: Payer: Self-pay | Admitting: Internal Medicine

## 2021-01-31 ENCOUNTER — Other Ambulatory Visit: Payer: Self-pay | Admitting: Internal Medicine

## 2021-02-14 ENCOUNTER — Other Ambulatory Visit: Payer: Self-pay | Admitting: Internal Medicine

## 2021-02-21 ENCOUNTER — Other Ambulatory Visit: Payer: Self-pay | Admitting: Internal Medicine

## 2021-03-31 ENCOUNTER — Other Ambulatory Visit: Payer: Self-pay | Admitting: Internal Medicine

## 2021-04-09 ENCOUNTER — Ambulatory Visit (INDEPENDENT_AMBULATORY_CARE_PROVIDER_SITE_OTHER): Payer: BC Managed Care – PPO | Admitting: Internal Medicine

## 2021-04-09 ENCOUNTER — Encounter: Payer: Self-pay | Admitting: Internal Medicine

## 2021-04-09 ENCOUNTER — Other Ambulatory Visit: Payer: Self-pay

## 2021-04-09 VITALS — BP 130/88 | HR 74 | Temp 97.9°F | Ht 63.0 in | Wt 160.4 lb

## 2021-04-09 DIAGNOSIS — F509 Eating disorder, unspecified: Secondary | ICD-10-CM | POA: Diagnosis not present

## 2021-04-09 DIAGNOSIS — E669 Obesity, unspecified: Secondary | ICD-10-CM | POA: Diagnosis not present

## 2021-04-09 DIAGNOSIS — I10 Essential (primary) hypertension: Secondary | ICD-10-CM | POA: Diagnosis not present

## 2021-04-09 MED ORDER — CONTRAVE 8-90 MG PO TB12: ORAL_TABLET | ORAL | 5 refills | Status: DC

## 2021-04-09 NOTE — Patient Instructions (Signed)
Ashlee Graves is doing well! ? ?Please encourage her to exercise regularly  ? ?Please check her BP a few times at home when she is calm and send me the readings ? ?Please get a blood test (CMET) at your earliest convenience (liver and kidney function)  ? ?[name] is completed a PA ("prior authorization:) for the Contrave  ?

## 2021-04-09 NOTE — Assessment & Plan Note (Signed)
Complicated by cognitive impairment since infancy,  Resulting in obesity if left untreated.  Continue Contrave for now.  ?

## 2021-04-09 NOTE — Progress Notes (Signed)
? ?Subjective:  ?Patient ID: Ashlee Graves, female    DOB: 07-Jul-1985  Age: 36 y.o. MRN: [id number] ? ?CC: The primary encounter diagnosis was Hypertension, unspecified type. Diagnoses of Obesity (BMI 30.0-34.9) and Eating disorder, unspecified type were also pertinent to this visit. ? ? ?This visit occurred during the SARS-CoV-2 public health emergency.  Safety protocols were in place, including screening questions prior to the visit, additional usage of staff PPE, and extensive cleaning of exam room while observing appropriate contact time as indicated for disinfecting solutions.   ? ?HPI ?Ashlee Graves presents for follow up on weight management.  She is accompanied by her mother  ?Chief Complaint  ?Patient presents with  ? Follow-up  ?  4 month follow up   ? ?Ashlee Graves's mother reports that her mood is better and she is tolerating the change from phentermine to Contrave.  She was beginning to walk for exercise right before Christmas,  but has not been walking or doing any form of exercise since then.  She attends day classes from 8:30 to 3:30  ? ?No new issues.  ? ? ?Outpatient Medications Prior to Visit  ?Medication Sig Dispense Refill  ? azelastine (OPTIVAR) 0.05 % ophthalmic solution INSTILL 1 DROP INTO BOTH EYES TWICE A DAY 18 mL 4  ? citalopram (CELEXA) 10 MG tablet TAKE 1 TABLET BY MOUTH EVERY DAY 90 tablet 1  ? fexofenadine (ALLEGRA) 180 MG tablet TAKE 1 TABLET (180 MG TOTAL) BY MOUTH 2 (TWO) TIMES DAILY AS NEEDED FOR ALLERGIES OR RHINITIS. 180 tablet 3  ? fluticasone (FLONASE) 50 MCG/ACT nasal spray SPRAY 2 SPRAYS INTO EACH NOSTRIL EVERY DAY 48 mL 3  ? hydroquinone 4 % cream Apply topically 2 (two) times daily. To dark patches at face. 28.35 g 2  ? hydrOXYzine (ATARAX) 25 MG tablet TAKE 1 TABLET BY MOUTH 3 TIMES DAILY AS NEEDED FOR ITCHING. 270 tablet 1  ? Multiple Vitamins-Minerals (HAIR VITAMINS PO) Take by mouth daily.     ? omeprazole (PRILOSEC) 40 MG capsule TAKE 1 CAPSULE BY MOUTH EVERY DAY 90 capsule  1  ? telmisartan (MICARDIS) 40 MG tablet TAKE 1 TABLET BY MOUTH EVERYDAY AT BEDTIME 90 tablet 1  ? TRI-ESTARYLLA 0.18/0.215/0.25 MG-35 MCG tablet TAKE 1 TABLET BY MOUTH EVERY DAY 84 tablet 0  ? triamcinolone cream (KENALOG) 0.1 % APPLY TO AFFECTED AREA TWICE A DAY 30 g 0  ? CONTRAVE 8-90 MG TB12 START 1 TABLET EVERY MORNING FOR 7 DAYS, THEN 1 TABLET TWICE DAILY FOR 7 DAYS, THEN 2 TABLETS EVERY MORNING AND ONE IN THE EVENING 90 tablet 1  ? ?No facility-administered medications prior to visit.  ? ? ?Review of Systems; ? ?Patient denies headache, fevers, malaise, unintentional weight loss, skin rash, eye pain, sinus congestion and sinus pain, sore throat, dysphagia,  hemoptysis , cough, dyspnea, wheezing, chest pain, palpitations, orthopnea, edema, abdominal pain, nausea, melena, diarrhea, constipation, flank pain, dysuria, hematuria, urinary  Frequency, nocturia, numbness, tingling, seizures,  Focal weakness, Loss of consciousness,  Tremor, insomnia, depression, anxiety, and suicidal ideation.   ? ? ? ?Objective:  ?BP 130/88 (BP Location: Left Arm, Patient Position: Sitting, Cuff Size: Large)   Pulse 74   Temp 97.9 ?F (36.6 ?C) (Oral)   Ht '5\' 3"'$  (1.6 m)   Wt 160 lb 6.4 oz (72.8 kg)   SpO2 98%   BMI 28.41 kg/m?  ? ?BP Readings from Last 3 Encounters:  ?04/09/21 130/88  ?12/04/20 108/72  ?08/28/20 118/84  ? ? ?Wt Readings  from Last 3 Encounters:  ?04/09/21 160 lb 6.4 oz (72.8 kg)  ?12/04/20 155 lb 3.2 oz (70.4 kg)  ?08/28/20 162 lb 6.4 oz (73.7 kg)  ? ? ?General appearance: alert, cooperative and appears stated age ?Ears: normal TM's and external ear canals both ears ?Throat: lips, mucosa, and tongue normal; teeth and gums normal ?Neck: no adenopathy, no carotid bruit, supple, symmetrical, trachea midline and thyroid not enlarged, symmetric, no tenderness/mass/nodules ?Back: symmetric, no curvature. ROM normal. No CVA tenderness. ?Lungs: clear to auscultation bilaterally ?Heart: regular rate and rhythm, S1, S2  normal, no murmur, click, rub or gallop ?Abdomen: soft, non-tender; bowel sounds normal; no masses,  no organomegaly ?Pulses: 2+ and symmetric ?Skin: Skin color, texture, turgor normal. No rashes or lesions ?Lymph nodes: Cervical, supraclavicular, and axillary nodes normal. ? ?Lab Results  ?Component Value Date  ? HGBA1C 5.3 09/23/2020  ? HGBA1C 5.1 03/27/2020  ? HGBA1C 5.2 05/21/2019  ? ? ?Lab Results  ?Component Value Date  ? CREATININE 0.7 03/27/2020  ? CREATININE 0.7 09/13/2019  ? CREATININE 0.7 05/20/2019  ? ? ?Lab Results  ?Component Value Date  ? WBC 7.9 03/27/2020  ? HGB 13.6 03/27/2020  ? HCT 45 05/20/2019  ? PLT 328 03/27/2020  ? CHOL 191 09/23/2020  ? TRIG 78 09/23/2020  ? HDL 56 09/23/2020  ? [place] 121 09/23/2020  ? ALT 25 03/27/2020  ? AST 22 03/27/2020  ? NA 137 03/27/2020  ? K 4.4 03/27/2020  ? CL 102 03/27/2020  ? CREATININE 0.7 03/27/2020  ? BUN 8 03/27/2020  ? CO2 22 03/27/2020  ? TSH 1.70 03/27/2020  ? HGBA1C 5.3 09/23/2020  ? MICROALBUR 5 09/23/2020  ? ? ? ?Assessment & Plan:  ? ?Problem List Items Addressed This Visit   ? ? Obesity (BMI 30.0-34.9)  ?  Tolerating contrave  With relatively stable weight . Continue current medication.  CMET needed  ?  ?  ? Eating disorder  ?  Complicated by cognitive impairment since infancy,  Resulting in obesity if left untreated.  Continue Contrave for now.  ?  ?  ? ?Other Visit Diagnoses   ? ? Hypertension, unspecified type    -  Primary  ? Relevant Orders  ? Comprehensive metabolic panel  ? ?  ? ? ?I spent 30 minutes dedicated to the care of this patient on the date of this encounter to include pre-visit review of patient's medical history,  most recent imaging studies, Face-to-face time with the patient , and post visit ordering of testing and therapeutics.   ? ?Follow-up: No follow-ups on file. ? ? ?Crecencio Mc, MD ?

## 2021-04-09 NOTE — Assessment & Plan Note (Signed)
Tolerating contrave  With relatively stable weight . Continue current medication.  CMET needed  ?

## 2021-04-15 ENCOUNTER — Telehealth: Payer: Self-pay

## 2021-04-15 NOTE — Telephone Encounter (Signed)
PA for contrave has been submitted and approved. Pt's mother is aware.  ?

## 2021-04-19 ENCOUNTER — Other Ambulatory Visit: Payer: Self-pay

## 2021-04-19 ENCOUNTER — Ambulatory Visit (INDEPENDENT_AMBULATORY_CARE_PROVIDER_SITE_OTHER): Payer: BC Managed Care – PPO | Admitting: Dermatology

## 2021-04-19 DIAGNOSIS — L578 Other skin changes due to chronic exposure to nonionizing radiation: Secondary | ICD-10-CM

## 2021-04-19 DIAGNOSIS — L811 Chloasma: Secondary | ICD-10-CM

## 2021-04-19 DIAGNOSIS — D2272 Melanocytic nevi of left lower limb, including hip: Secondary | ICD-10-CM | POA: Diagnosis not present

## 2021-04-19 DIAGNOSIS — L219 Seborrheic dermatitis, unspecified: Secondary | ICD-10-CM

## 2021-04-19 DIAGNOSIS — D225 Melanocytic nevi of trunk: Secondary | ICD-10-CM | POA: Diagnosis not present

## 2021-04-19 DIAGNOSIS — Z1283 Encounter for screening for malignant neoplasm of skin: Secondary | ICD-10-CM

## 2021-04-19 DIAGNOSIS — L814 Other melanin hyperpigmentation: Secondary | ICD-10-CM

## 2021-04-19 DIAGNOSIS — D1800 Hemangioma unspecified site: Secondary | ICD-10-CM

## 2021-04-19 DIAGNOSIS — D229 Melanocytic nevi, unspecified: Secondary | ICD-10-CM | POA: Diagnosis not present

## 2021-04-19 MED ORDER — KETOCONAZOLE 2 % EX SHAM: 1.0000 "application " | MEDICATED_SHAMPOO | CUTANEOUS | 6 refills | Status: DC

## 2021-04-19 NOTE — Progress Notes (Signed)
? ?Follow-Up Visit ?  ?Subjective  ?Ashlee Graves is a 36 y.o. female who presents for the following: Total body skin exam. ? ?Patient accompanied by mother. ? ?The patient presents for Total-Body Skin Exam (TBSE) for skin cancer screening and mole check.  The patient has spots, moles and lesions to be evaluated, some may be new or changing and the patient has concerns that these could be cancer.  ? ?The following portions of the chart were reviewed this encounter and updated as appropriate:  ?  ?  ? ?Review of Systems:  No other skin or systemic complaints except as noted in HPI or Assessment and Plan. ? ?Objective  ?Well appearing patient in no apparent distress; mood and affect are within normal limits. ? ?A full examination was performed including scalp, head, eyes, ears, nose, lips, neck, chest, axillae, abdomen, back, buttocks, bilateral upper extremities, bilateral lower extremities, hands, feet, fingers, toes, fingernails, and toenails. All findings within normal limits unless otherwise noted below. ? ? ? ?L lower pretibial ?7.0 x 6.78m speckled brown pap within scar ? ?Right Lower Back ?5.061mBrown macules within scar, recurrent nevus ? ?L spinal mid back ?5.0 x 3.9m18med brown macule with darker center,  ? ?L spinal lower back ?4.9mm51md brown macule with notch ? ?Right Abdomen (side) - Lower ?5.0 x 3.9mm 63m brown macule with notch ? ?chest ?Brown macules chest, back arms, legs ?Photos compared, no changes, new photo chest today. ? ? ? ? ?bil cheeks ?Reticulated hyperpigmented patches.  ? ?Scalp ?Diffuse mild scaling with focal pink scaly patches scalp ? ? ? ?Assessment & Plan  ? ?Lentigines ?- Scattered tan macules ?- Due to sun exposure ?- Benign-appearing, observe ?- Recommend daily broad spectrum sunscreen SPF 30+ to sun-exposed areas, reapply every 2 hours as needed. ?- Call for any changes ?- face, shoulders ? ?Melanocytic Nevi ?- Tan-brown and/or pink-flesh-colored symmetric macules and papules ?-  Benign appearing on exam today ?- Observation ?- Call clinic for new or changing moles ?- Recommend daily use of broad spectrum spf 30+ sunscreen to sun-exposed areas.  ? ?Hemangiomas ?- Red papules ?- Discussed benign nature ?- Observe ?- Call for any changes ?- trunk ? ?Actinic Damage ?- Chronic condition, secondary to cumulative UV/sun exposure ?- diffuse scaly erythematous macules with underlying dyspigmentation ?- Recommend daily broad spectrum sunscreen SPF 30+ to sun-exposed areas, reapply every 2 hours as needed.  ?- Staying in the shade or wearing long sleeves, sun glasses (UVA+UVB protection) and wide brim hats (4-inch brim around the entire circumference of the hat) are also recommended for sun protection.  ?- Call for new or changing lesions. ? ?Skin cancer screening performed today. ? ?Nevus (6) ?L lower pretibial; chest; Right Abdomen (side) - Lower; Right Lower Back; L spinal lower back; L spinal mid back ? ?Stable ?Benign-appearing.  Observation.  Call clinic for new or changing moles.  Recommend daily use of broad spectrum spf 30+ sunscreen to sun-exposed areas.   ? ?Melasma ?bil cheeks ? ?Melasma is a chronic condition of persistent pigmented patches generally on the face, worse in summer due to higher UV exposure.  Oral estrogen containing BCPs or supplements can exacerbate condition.  Recommend daily broad spectrum tinted sunscreen SPF 30+ to face, preferably with Zinc or Titanium Dioxide. Discussed Rx topical bleaching creams (i.e. hydroquinone), OTC HelioCare supplement, chemical peels (would need multiple for best result).  ? ?Cont Hydroquinone 4% bid during summer. ?Continue daily spf 30+ to face ? ?Related Medications ?  hydroquinone 4 % cream ?Apply topically 2 (two) times daily. To dark patches at face. ? ?Seborrheic dermatitis ?Scalp ? ?Chronic and persistent condition with duration or expected duration over one year. Condition is bothersome/symptomatic for patient. Currently flared.   ? ?Seborrheic Dermatitis  ?-  is a chronic persistent rash characterized by pinkness and scaling most commonly of the mid face but also can occur on the scalp (dandruff), ears; mid chest, mid back and groin.  It tends to be exacerbated by stress and cooler weather.  People who have neurologic disease may experience new onset or exacerbation of existing seborrheic dermatitis.  The condition is not curable but treatable and can be controlled. ? ?Start Ketoconazole 2% shampoo 3x/wk, let sit 5 minutes and rinse out ?Will send in topical steroid solution if still itching ? ?ketoconazole (NIZORAL) 2 % shampoo - Scalp ?Apply 1 application. topically 3 (three) times a week. Wash scalp 3 times weekly, let sit 5 minutes and rinse out ? ? ?Return in about 1 year (around 04/20/2022) for TBSE. ? ?I, Sonya Hupman, RMA, am acting as scribe for Brendolyn Patty, MD . ? ?Documentation: I have reviewed the above documentation for accuracy and completeness, and I agree with the above. ? ?Brendolyn Patty MD  ? ?

## 2021-04-19 NOTE — Patient Instructions (Signed)

## 2021-04-22 ENCOUNTER — Telehealth: Payer: Self-pay

## 2021-04-22 NOTE — Telephone Encounter (Signed)
CONTRAVE '8MG'$  - 90 TAB ER approved from 03/16/2021-04/15/2022 ? ? ?

## 2021-04-23 LAB — HEPATIC FUNCTION PANEL
ALT: 12 U/L (ref 7–35)
AST: 18 (ref 13–35)
Alkaline Phosphatase: 57 (ref 25–125)
Bilirubin, Total: 0.2

## 2021-04-23 LAB — BASIC METABOLIC PANEL
BUN: 9 (ref 4–21)
CO2: 23 — AB (ref 13–22)
Chloride: 103 (ref 99–108)
Creatinine: 0.8 (ref 0.5–1.1)
Glucose: 83
Potassium: 4.4 mEq/L (ref 3.5–5.1)
Sodium: 138 (ref 137–147)

## 2021-04-23 LAB — COMPREHENSIVE METABOLIC PANEL
Albumin: 3.9 (ref 3.5–5.0)
Calcium: 8.9 (ref 8.7–10.7)
Globulin: 2.6
eGFR: 101

## 2021-04-25 ENCOUNTER — Other Ambulatory Visit: Payer: Self-pay | Admitting: Internal Medicine

## 2021-04-26 ENCOUNTER — Other Ambulatory Visit: Payer: Self-pay

## 2021-04-26 MED ORDER — NORGESTIM-ETH ESTRAD TRIPHASIC 0.18/0.215/0.25 MG-35 MCG PO TABS: 1.0000 | ORAL_TABLET | Freq: Every day | ORAL | 0 refills | Status: DC

## 2021-05-03 ENCOUNTER — Encounter: Payer: Self-pay | Admitting: Internal Medicine

## 2021-05-13 ENCOUNTER — Telehealth: Payer: Self-pay

## 2021-05-13 NOTE — Telephone Encounter (Signed)
Pt's mother hs been notified of lab results.  ?

## 2021-05-13 NOTE — Telephone Encounter (Signed)
-----   Message from Crecencio Mc, MD sent at 05/13/2021  8:44 AM EDT ----- ?Her outside labs have been reviewed and are normal ? ?Regards, ? ? ?Deborra Medina, MD   ? ? ? ?

## 2021-06-07 ENCOUNTER — Other Ambulatory Visit: Payer: Self-pay | Admitting: Internal Medicine

## 2021-06-19 ENCOUNTER — Other Ambulatory Visit: Payer: Self-pay | Admitting: Internal Medicine

## 2021-07-16 ENCOUNTER — Other Ambulatory Visit: Payer: Self-pay

## 2021-07-16 ENCOUNTER — Other Ambulatory Visit: Payer: Self-pay | Admitting: Internal Medicine

## 2021-07-16 MED ORDER — NORGESTIM-ETH ESTRAD TRIPHASIC 0.18/0.215/0.25 MG-35 MCG PO TABS: 1.0000 | ORAL_TABLET | Freq: Every day | ORAL | 0 refills | Status: DC

## 2021-08-09 ENCOUNTER — Encounter: Payer: Self-pay | Admitting: Internal Medicine

## 2021-08-09 NOTE — Telephone Encounter (Signed)
Printed and given to front office to have scanned into chart.

## 2021-08-13 ENCOUNTER — Ambulatory Visit: Payer: Medicare Other | Admitting: Internal Medicine

## 2021-08-20 ENCOUNTER — Encounter: Payer: Self-pay | Admitting: Internal Medicine

## 2021-08-20 ENCOUNTER — Ambulatory Visit (INDEPENDENT_AMBULATORY_CARE_PROVIDER_SITE_OTHER): Payer: Medicare Other | Admitting: Internal Medicine

## 2021-08-20 DIAGNOSIS — E669 Obesity, unspecified: Secondary | ICD-10-CM

## 2021-08-20 DIAGNOSIS — F09 Unspecified mental disorder due to known physiological condition: Secondary | ICD-10-CM | POA: Diagnosis not present

## 2021-08-20 DIAGNOSIS — F411 Generalized anxiety disorder: Secondary | ICD-10-CM | POA: Diagnosis not present

## 2021-08-20 MED ORDER — OZEMPIC (1 MG/DOSE) 4 MG/3ML ~~LOC~~ SOPN
1.0000 mg | PEN_INJECTOR | SUBCUTANEOUS | 2 refills | Status: DC
Start: 2021-08-20 — End: 2021-09-12

## 2021-08-20 MED ORDER — CITALOPRAM HYDROBROMIDE 10 MG PO TABS: 10.0000 mg | ORAL_TABLET | Freq: Every day | ORAL | 1 refills | Status: DC

## 2021-08-20 MED ORDER — OMEPRAZOLE 40 MG PO CPDR: DELAYED_RELEASE_CAPSULE | ORAL | 1 refills | Status: DC

## 2021-08-20 MED ORDER — TELMISARTAN 40 MG PO TABS: ORAL_TABLET | ORAL | 1 refills | Status: DC

## 2021-08-20 NOTE — Progress Notes (Unsigned)
Subjective:  Patient ID: Ashlee Graves, female    DOB: 01/27/1985  Age: 36 y.o. MRN: [id number]  CC: There were no encounter diagnoses.   HPI Ashlee Graves presents for  Chief Complaint  Patient presents with   Follow-up    4 month follow up       Outpatient Medications Prior to Visit  Medication Sig Dispense Refill   azelastine (OPTIVAR) 0.05 % ophthalmic solution INSTILL 1 DROP INTO BOTH EYES TWICE A DAY 18 mL 4   citalopram (CELEXA) 10 MG tablet TAKE 1 TABLET BY MOUTH EVERY DAY 90 tablet 1   fexofenadine (ALLEGRA) 180 MG tablet TAKE 1 TABLET (180 MG TOTAL) BY MOUTH 2 (TWO) TIMES DAILY AS NEEDED FOR ALLERGIES OR RHINITIS. 180 tablet 3   fluticasone (FLONASE) 50 MCG/ACT nasal spray SPRAY 2 SPRAYS INTO EACH NOSTRIL EVERY DAY 48 mL 3   hydroquinone 4 % cream Apply topically 2 (two) times daily. To dark patches at face. 28.35 g 2   hydrOXYzine (ATARAX) 25 MG tablet TAKE 1 TABLET BY MOUTH 3 TIMES DAILY AS NEEDED FOR ITCHING. 270 tablet 1   ketoconazole (NIZORAL) 2 % shampoo Apply 1 application. topically 3 (three) times a week. Wash scalp 3 times weekly, let sit 5 minutes and rinse out 120 mL 6   Multiple Vitamins-Minerals (HAIR VITAMINS PO) Take by mouth daily.      Naltrexone-buPROPion HCl ER (CONTRAVE) 8-90 MG TB12 2 tablets in the morning , one in the evening 90 tablet 5   Norgestimate-Ethinyl Estradiol Triphasic (TRI-ESTARYLLA) 0.18/0.215/0.25 MG-35 MCG tablet Take 1 tablet by mouth daily. 84 tablet 0   omeprazole (PRILOSEC) 40 MG capsule TAKE 1 CAPSULE BY MOUTH EVERY DAY 90 capsule 1   telmisartan (MICARDIS) 40 MG tablet TAKE 1 TABLET BY MOUTH EVERYDAY AT BEDTIME 90 tablet 1   triamcinolone cream (KENALOG) 0.1 % APPLY TO AFFECTED AREA TWICE A DAY 30 g 0   No facility-administered medications prior to visit.    Review of Systems;  Patient denies headache, fevers, malaise, unintentional weight loss, skin rash, eye pain, sinus congestion and sinus pain, sore throat, dysphagia,   hemoptysis , cough, dyspnea, wheezing, chest pain, palpitations, orthopnea, edema, abdominal pain, nausea, melena, diarrhea, constipation, flank pain, dysuria, hematuria, urinary  Frequency, nocturia, numbness, tingling, seizures,  Focal weakness, Loss of consciousness,  Tremor, insomnia, depression, anxiety, and suicidal ideation.      Objective:  BP 122/84 (BP Location: Left Arm, Patient Position: Sitting, Cuff Size: Normal)   Pulse 74   Temp 98.1 F (36.7 C) (Oral)   Ht '5\' 3"'$  (1.6 m)   Wt 157 lb 3.2 oz (71.3 kg)   SpO2 98%   BMI 27.85 kg/m   BP Readings from Last 3 Encounters:  08/20/21 122/84  04/09/21 130/88  12/04/20 108/72    Wt Readings from Last 3 Encounters:  08/20/21 157 lb 3.2 oz (71.3 kg)  04/09/21 160 lb 6.4 oz (72.8 kg)  12/04/20 155 lb 3.2 oz (70.4 kg)    General appearance: alert, cooperative and appears stated age Ears: normal TM's and external ear canals both ears Throat: lips, mucosa, and tongue normal; teeth and gums normal Neck: no adenopathy, no carotid bruit, supple, symmetrical, trachea midline and thyroid not enlarged, symmetric, no tenderness/mass/nodules Back: symmetric, no curvature. ROM normal. No CVA tenderness. Lungs: clear to auscultation bilaterally Heart: regular rate and rhythm, S1, S2 normal, no murmur, click, rub or gallop Abdomen: soft, non-tender; bowel sounds normal; no masses,  no organomegaly  Pulses: 2+ and symmetric Skin: Skin color, texture, turgor normal. No rashes or lesions Lymph nodes: Cervical, supraclavicular, and axillary nodes normal.  Lab Results  Component Value Date   HGBA1C 5.3 09/23/2020   HGBA1C 5.1 03/27/2020   HGBA1C 5.2 05/21/2019    Lab Results  Component Value Date   CREATININE 0.8 04/23/2021   CREATININE 0.7 03/27/2020   CREATININE 0.7 09/13/2019    Lab Results  Component Value Date   WBC 7.9 03/27/2020   HGB 13.6 03/27/2020   HCT 45 05/20/2019   PLT 328 03/27/2020   CHOL 191 09/23/2020   TRIG  78 09/23/2020   HDL 56 09/23/2020   LDLCALC 121 09/23/2020   ALT 12 04/23/2021   AST 18 04/23/2021   NA 138 04/23/2021   K 4.4 04/23/2021   CL 103 04/23/2021   CREATININE 0.8 04/23/2021   BUN 9 04/23/2021   CO2 23 (A) 04/23/2021   TSH 1.70 03/27/2020   HGBA1C 5.3 09/23/2020   MICROALBUR 5 09/23/2020    US Abdomen Limited RUQ  Result Date: 09/13/2019 CLINICAL DATA:  Recurrent vomiting and right upper quadrant pain. EXAM: ULTRASOUND ABDOMEN LIMITED RIGHT UPPER QUADRANT COMPARISON:  None. FINDINGS: Gallbladder: No filled with sludge and shadowing stones. Physiologically distended. There is no wall thickening or pericholecystic fluid. No sonographic Murphy sign noted by sonographer. Common bile duct: Diameter: 6-7 mm. Distal portion is obscured by overlying bowel gas. No choledocholithiasis in the visualized portion. Liver: No focal lesion identified. Heterogeneous and mildly increased in parenchymal echogenicity. Portal vein is patent on color Doppler imaging with normal direction of blood flow towards the liver. Other: No right upper quadrant ascites/free fluid. IMPRESSION: 1. Sludge and stones in the gallbladder. No sonographic findings of acute cholecystitis. 2. Common bile duct at 6-7 mm, borderline dilated. No choledocholithiasis in the visualized portion. Recommend correlation with LFTs. If LFTs are elevated, recommend further evaluation with MRCP. 3. Borderline mild hepatic steatosis. Electronically Signed   By: Keith Rake M.D.   On: 09/13/2019 15:56    Assessment & Plan:   Problem List Items Addressed This Visit   None   I spent a total of   minutes with this patient in a face to face visit on the date of this encounter reviewing the last office visit with me on        ,  most recent with patient's cardiologist in    ,  patient'ss diet and eating habits, home blood pressure readings ,  most recent imaging study ,   and post visit ordering of testing and therapeutics.     Follow-up: No follow-ups on file.   Crecencio Mc, MD

## 2021-08-20 NOTE — Patient Instructions (Addendum)
I am recommending an WEEKLY  injectable medication to help Ashlee Graves's  appetite,  It is called Ozempic.  It slows down your stomach emptying so she stays  full longer . If the 1 mg dose is affordable,  pick up the medication, and let us know so we can set aside samples of the lower doses (0.25 mg and 0.5 mg )  to use FIRST

## 2021-08-22 NOTE — Assessment & Plan Note (Signed)
Secondary to overeating with  limited insight due to intellectual incapacity and lack of supervision.  She was tolerating contrave  With relatively stable weight but due to insurance changes it is no longer affordable.  Screened for contrainidicatons to GP 1 agnoist and there are none.  Ozempic appears to be on the preferred list at the igher doses.  Will send rx for ozempic to pharmacist . If the medication is affordable,  She will fll and put aside for future use and start at hte 0.25 mg weekly dose.

## 2021-08-22 NOTE — Assessment & Plan Note (Signed)
She has not had neurologic follow up since adolescence base on mother's preference .  She was educated in the public school system in the Special Ed classes and finished high school.  She continues to attend classes for special disability at [employer]. Her mother notes that she has been more forgetful about her medications, requiring parental supervision,  But remains able ro use her cell phone.

## 2021-08-22 NOTE — Assessment & Plan Note (Addendum)
Managed with low dose celexa .  No changes today some moodiness at home discussed,  Triggered by interactions with her natural father who has had some mood lability since suffering a stroke .  Her mother ahs recently reitred and plans to  Spend more time with her

## 2021-08-25 ENCOUNTER — Other Ambulatory Visit: Payer: Self-pay | Admitting: Internal Medicine

## 2021-09-07 ENCOUNTER — Encounter: Payer: Self-pay | Admitting: Internal Medicine

## 2021-09-09 NOTE — Telephone Encounter (Signed)
The dose that was sent in for her was the 1 mg dose. Pt has not been on medication so does she need to start at the .25 mg dose first?

## 2021-09-10 NOTE — Telephone Encounter (Signed)
Patient's mom, Braelyn Jenson, states she would like to apologize for her typo, the pen is '1mg'$  instead of '4mg'$ .  Abigail Butts states patient's insurance will cover the cost of the medication through the end of this year.  Abigail Butts states patient has a Designer, jewellery coming to see her between 8am and 12pm on Monday (09/13/2021), so she is planning to wait until that time to start patient on Semaglutide, 1 MG/DOSE, (OZEMPIC, 1 MG/DOSE,) 4 MG/3ML SOPN.  Abigail Butts states she would like to know if Dr. Deborra Medina would like for her to have the nurse practitioner help them with the first injection, or would she like for them to come to our office.  I did let Abigail Butts know that Adair Laundry, CMA, is verifying dosage with Dr. Deborra Medina.  Dagoberto Reef states patient's preferred pharmacy is [facility] in [place] and asked that we please remove [facility] from her record.

## 2021-09-12 ENCOUNTER — Other Ambulatory Visit: Payer: Self-pay | Admitting: Internal Medicine

## 2021-09-12 MED ORDER — OZEMPIC (0.25 OR 0.5 MG/DOSE) 2 MG/3ML ~~LOC~~ SOPN: 0.2500 mg | PEN_INJECTOR | SUBCUTANEOUS | 2 refills | Status: DC

## 2021-09-13 ENCOUNTER — Ambulatory Visit: Payer: Medicare Other

## 2021-09-13 NOTE — Telephone Encounter (Signed)
[name], CMA spoke with patient's mom to let her know that the 0.25/0.5 MG was sent yesterday. They did not need to come in for teaching using larger pens trying to get lower dose.

## 2021-09-20 ENCOUNTER — Other Ambulatory Visit: Payer: Self-pay | Admitting: Internal Medicine

## 2021-09-20 MED ORDER — SEMAGLUTIDE-WEIGHT MANAGEMENT 0.25 MG/0.5ML ~~LOC~~ SOAJ: 0.2500 mg | SUBCUTANEOUS | 2 refills | Status: AC

## 2021-09-21 NOTE — Telephone Encounter (Signed)
PA for [employer] has been submitted on covermymeds.  °

## 2021-09-28 ENCOUNTER — Telehealth: Payer: Self-pay | Admitting: Internal Medicine

## 2021-09-28 NOTE — Telephone Encounter (Signed)
Copied from [place] [id number]. Topic: Medicare AWV >> Sep 28, 2021 11:29 AM Devoria Glassing wrote: Reason for CRM: Left message for patient to schedule Annual Wellness Visit.  Please schedule (telephone/video call) with Nurse Health Advisor Charlott Rakes, RN at [facility]. Please call [phone] ask for [facility]

## 2021-11-08 ENCOUNTER — Encounter: Payer: Self-pay | Admitting: Internal Medicine

## 2021-11-10 ENCOUNTER — Encounter: Payer: Self-pay | Admitting: Internal Medicine

## 2021-11-11 MED ORDER — NORGESTIM-ETH ESTRAD TRIPHASIC 0.18/0.215/0.25 MG-35 MCG PO TABS
1.0000 | ORAL_TABLET | Freq: Every day | ORAL | 3 refills | Status: DC
Start: 2021-11-11 — End: 2022-08-19

## 2021-11-17 ENCOUNTER — Telehealth: Payer: Self-pay | Admitting: Internal Medicine

## 2021-11-17 NOTE — Telephone Encounter (Signed)
Copied from [place] [id number]. Topic: Medicare AWV >> Nov 17, 2021  3:23 PM Devoria Glassing wrote: Reason for CRM: Left message for patient to schedule Annual Wellness Visit.  Please schedule with Nurse Health Advisor Denisa O'Brien-Blaney, LPN at [facility]. This appt can be telephone or office visit.  Please call [phone] ask for [facility]

## 2021-12-08 ENCOUNTER — Encounter: Payer: Self-pay | Admitting: Internal Medicine

## 2021-12-09 ENCOUNTER — Ambulatory Visit: Payer: Medicare Other

## 2021-12-24 ENCOUNTER — Encounter: Payer: Self-pay | Admitting: Internal Medicine

## 2021-12-24 ENCOUNTER — Ambulatory Visit (INDEPENDENT_AMBULATORY_CARE_PROVIDER_SITE_OTHER): Payer: Medicare Other | Admitting: Internal Medicine

## 2021-12-24 VITALS — BP 132/80 | HR 70 | Temp 98.2°F | Ht 63.0 in | Wt 161.8 lb

## 2021-12-24 DIAGNOSIS — E669 Obesity, unspecified: Secondary | ICD-10-CM | POA: Diagnosis not present

## 2021-12-24 DIAGNOSIS — R5383 Other fatigue: Secondary | ICD-10-CM

## 2021-12-24 DIAGNOSIS — Z23 Encounter for immunization: Secondary | ICD-10-CM

## 2021-12-24 DIAGNOSIS — E66811 Obesity, class 1: Secondary | ICD-10-CM

## 2021-12-24 DIAGNOSIS — I10 Essential (primary) hypertension: Secondary | ICD-10-CM

## 2021-12-24 DIAGNOSIS — R7301 Impaired fasting glucose: Secondary | ICD-10-CM | POA: Diagnosis not present

## 2021-12-24 NOTE — Assessment & Plan Note (Signed)
Tolerating Ozempic.  Advised mother to increase dose to 0.5 mg weekly for a minimum of 4 weeks . Advance to 1 .0 mg fter 4 weeks if weight has reached a plateau.  Strongly advised her mother to supervise her exercise so that is done daily

## 2021-12-24 NOTE — Patient Instructions (Addendum)
Increase the ozempic  dose to  0.5 mg weekly for 4 weeks.  continue 0.5 mg  weekly if shekela is still losing weight by week 4,  I  if  she is not losing weight by week 4,  you can  double the Ozempic dose to 1.0 mg using the 0.5 mg pens   The sleepiness may be due to morning dose of hydroxyzine .  Try omitting the morning dose .  IF THIS DOESN'T HELP, limit her naps to 30 minutes  Ashlee Graves MUST WALK FOR EXERCISE FOR 30 MINUTES DAILY  as PART OF HER WEIGHT LOSS/WEIGHT MANAGEMENT PLAN

## 2021-12-24 NOTE — Progress Notes (Unsigned)
Subjective:  Patient ID: Ashlee Graves, female    DOB: 1985/11/03  Age: 36 y.o. MRN: [id number]  CC: The primary encounter diagnosis was Essential hypertension. Diagnoses of Other fatigue, Impaired fasting glucose, and Obesity (BMI 30.0-34.9) were also pertinent to this visit.   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  She was started  on ozempic 8 weeks ago and has been tolerating the 0.25 mg dose without nausea.  Unfortunately her mother did not weigh her at the outset , so she is not sure if she has lost any weight. She recalls that she ate more over Thanksgiving.  Ashlee Graves is not exercising .  She attends day school from 8 to 3:30 , and per mother has been coming home and napping for up to 1.5 hours; before dinner. Mother usually wakes her up before that.   She takes hydroxyzine twice daily for neurotic itching. She has been observed to snore at times,  but mother is has not noticed any  apneic breathing patterns        Outpatient Medications Prior to Visit  Medication Sig Dispense Refill   citalopram (CELEXA) 10 MG tablet Take 1 tablet (10 mg total) by mouth daily. 90 tablet 1   fexofenadine (ALLEGRA) 180 MG tablet TAKE 1 TABLET (180 MG TOTAL) BY MOUTH 2 (TWO) TIMES DAILY AS NEEDED FOR ALLERGIES OR RHINITIS. 180 tablet 3   hydroquinone 4 % cream Apply topically 2 (two) times daily. To dark patches at face. 28.35 g 2   hydrOXYzine (ATARAX) 25 MG tablet TAKE 1 TABLET BY MOUTH 3 TIMES DAILY AS NEEDED FOR ITCHING. 270 tablet 1   Multiple Vitamins-Minerals (HAIR VITAMINS PO) Take by mouth daily.      Norgestimate-Ethinyl Estradiol Triphasic (TRI-ESTARYLLA) 0.18/0.215/0.25 MG-35 MCG tablet Take 1 tablet by mouth  daily. 84 tablet 3   omeprazole (PRILOSEC) 40 MG capsule TAKE 1 CAPSULE BY MOUTH EVERY DAY 90 capsule 1   OZEMPIC, 0.25 OR 0.5 MG/DOSE, 2 MG/3ML SOPN Inject 0.25 mg into the skin once a week.     telmisartan (MICARDIS) 40 MG tablet TAKE 1 TABLET BY MOUTH EVERYDAY AT BEDTIME 90 tablet 1   triamcinolone cream (KENALOG) 0.1 % APPLY TO AFFECTED AREA TWICE A DAY 30 g 0   ketoconazole (NIZORAL) 2 % shampoo Apply 1 application. topically 3 (three) times a week. Wash scalp 3 times weekly, let sit 5 minutes and rinse out (Patient not taking: Reported on 12/24/2021) 120 mL 6   azelastine (OPTIVAR) 0.05 % ophthalmic solution INSTILL 1 DROP INTO BOTH EYES TWICE A DAY (Patient not taking: Reported on 12/24/2021) 18 mL 4   fluticasone (FLONASE) 50 MCG/ACT nasal spray SPRAY 2 SPRAYS INTO EACH NOSTRIL EVERY DAY (Patient not taking: Reported on 12/24/2021) 48 mL 3   Naltrexone-buPROPion HCl ER (CONTRAVE) 8-90 MG TB12 2 tablets in the morning , one in the evening (Patient not taking: Reported on 12/24/2021) 90 tablet 5   No facility-administered medications prior to visit.    Review of Systems;  Patient denies headache, fevers, malaise, unintentional weight loss, skin rash, eye pain, sinus congestion and sinus pain, sore throat, dysphagia,  hemoptysis , cough, dyspnea, wheezing, chest pain, palpitations,  orthopnea, edema, abdominal pain, nausea, melena, diarrhea, constipation, flank pain, dysuria, hematuria, urinary  Frequency, nocturia, numbness, tingling, seizures,  Focal weakness, Loss of consciousness,  Tremor, insomnia, depression, anxiety, and suicidal ideation.      Objective:  BP 132/80   Pulse 70   Temp 98.2 F (36.8 C)   Ht '5\' 3"'$  (1.6 m)   Wt 161 lb 12.8 oz (73.4 kg)   SpO2 99%   BMI 28.66 kg/m   BP Readings from Last 3 Encounters:  12/24/21 132/80  08/20/21 122/84  04/09/21 130/88    Wt Readings from Last 3 Encounters:  12/24/21 161 lb 12.8 oz (73.4 kg)  08/20/21 157 lb 3.2 oz (71.3 kg)   04/09/21 160 lb 6.4 oz (72.8 kg)    General appearance: alert, cooperative and appears stated age Ears: normal TM's and external ear canals both ears Throat: lips, mucosa, and tongue normal; teeth and gums normal Neck: no adenopathy, no carotid bruit, supple, symmetrical, trachea midline and thyroid not enlarged, symmetric, no tenderness/mass/nodules Back: symmetric, no curvature. ROM normal. No CVA tenderness. Lungs: clear to auscultation bilaterally Heart: regular rate and rhythm, S1, S2 normal, no murmur, click, rub or gallop Abdomen: soft, non-tender; bowel sounds normal; no masses,  no organomegaly Pulses: 2+ and symmetric Skin: Skin color, texture, turgor normal. No rashes or lesions Lymph nodes: Cervical, supraclavicular, and axillary nodes normal. Neuro:  awake and interactive with normal mood and affect. Higher cortical functions are normal. Speech is clear without word-finding difficulty or dysarthria. Extraocular movements are intact. Visual fields of both eyes are grossly intact. Sensation to light touch is grossly intact bilaterally of upper and lower extremities. Motor examination shows 4+/5 symmetric hand grip and upper extremity and 5/5 lower extremity strength. There is no pronation or drift. Gait is non-ataxic   Lab Results  Component Value Date   HGBA1C 5.3 09/23/2020   HGBA1C 5.1 03/27/2020   HGBA1C 5.2 05/21/2019    Lab Results  Component Value Date   CREATININE 0.8 04/23/2021   CREATININE 0.7 03/27/2020   CREATININE 0.7 09/13/2019    Lab Results  Component Value Date   WBC 7.9 03/27/2020   HGB 13.6 03/27/2020   HCT 45 05/20/2019   PLT 328 03/27/2020   CHOL 191 09/23/2020   TRIG 78 09/23/2020   HDL 56 09/23/2020   LDLCALC 121 09/23/2020   ALT 12 04/23/2021   AST 18 04/23/2021   NA 138 04/23/2021   K 4.4 04/23/2021   CL 103 04/23/2021   CREATININE 0.8 04/23/2021   BUN 9 04/23/2021   CO2 23 (A) 04/23/2021   TSH 1.70 03/27/2020   HGBA1C 5.3  09/23/2020   MICROALBUR 5 09/23/2020    US Abdomen Limited RUQ  Result Date: 09/13/2019 CLINICAL DATA:  Recurrent vomiting and right upper quadrant pain. EXAM: ULTRASOUND ABDOMEN LIMITED RIGHT UPPER QUADRANT COMPARISON:  None. FINDINGS: Gallbladder: No filled with sludge and shadowing stones. Physiologically distended. There is no wall thickening or pericholecystic fluid. No sonographic Murphy sign noted by sonographer. Common bile duct: Diameter: 6-7 mm. Distal portion is obscured by overlying bowel gas. No choledocholithiasis in the visualized portion. Liver: No focal lesion identified. Heterogeneous and mildly increased in parenchymal echogenicity. Portal vein is patent on color Doppler imaging with normal direction of blood flow towards the liver. Other: No right upper quadrant ascites/free fluid. IMPRESSION: 1. Sludge and stones in the gallbladder. No sonographic findings of acute cholecystitis. 2. Common bile duct at 6-7 mm, borderline dilated. No choledocholithiasis  in the visualized portion. Recommend correlation with LFTs. If LFTs are elevated, recommend further evaluation with MRCP. 3. Borderline mild hepatic steatosis. Electronically Signed   By: Keith Rake M.D.   On: 09/13/2019 15:56    Assessment & Plan:   Problem List Items Addressed This Visit     Obesity (BMI 30.0-34.9)    Tolerating Ozempic.  Advised mother to increase dose to 0.5 mg weekly for a minimum of 4 weeks . Advance to 1 .0 mg fter 4 weeks if weight has reached a plateau      Essential hypertension - Primary   Relevant Orders   COMPLETE METABOLIC PANEL WITH GFR   Lipid panel   Other Visit Diagnoses     Other fatigue       Relevant Orders   TSH   Impaired fasting glucose       Relevant Orders   Hemoglobin A1c       I spent a total of   minutes with this patient in a face to face visit on the date of this encounter reviewing the last office visit with me in       ,  most recent visit with cardiology ,     ,  patient's diet and exercise habits, home blood pressure /blod sugar readings, recent ER visit including labs and imaging studies ,   and post visit ordering of testing and therapeutics.    Follow-up: No follow-ups on file.   Crecencio Mc, MD

## 2021-12-25 LAB — LIPID PANEL
Cholesterol: 172 mg/dL (ref <200)
HDL: 46 mg/dL — ABNORMAL LOW (ref >=50)
LDL Cholesterol (Calc): 106 mg/dL (calc) — ABNORMAL HIGH
Non-HDL Cholesterol (Calc): 126 mg/dL (calc) (ref <130)
Total CHOL/HDL Ratio: 3.7 (calc) (ref <5.0)
Triglycerides: 105 mg/dL (ref <150)

## 2021-12-25 LAB — HEMOGLOBIN A1C
Hgb A1c MFr Bld: 5.2 % of total Hgb (ref <5.7)
Mean Plasma Glucose: 103 mg/dL
eAG (mmol/L): 5.7 mmol/L

## 2021-12-25 LAB — COMPLETE METABOLIC PANEL WITH GFR
AG Ratio: 1.2 (calc) (ref 1.0–2.5)
ALT: 23 U/L (ref 6–29)
AST: 14 U/L (ref 10–30)
Albumin: 3.8 g/dL (ref 3.6–5.1)
Alkaline phosphatase (APISO): 59 U/L (ref 31–125)
BUN: 13 mg/dL (ref 7–25)
CO2: 25 mmol/L (ref 20–32)
Calcium: 8.8 mg/dL (ref 8.6–10.2)
Chloride: 103 mmol/L (ref 98–110)
Creat: 0.7 mg/dL (ref 0.50–0.97)
Globulin: 3.1 g/dL (calc) (ref 1.9–3.7)
Glucose, Bld: 72 mg/dL (ref 65–99)
Potassium: 4.8 mmol/L (ref 3.5–5.3)
Sodium: 136 mmol/L (ref 135–146)
Total Bilirubin: 0.3 mg/dL (ref 0.2–1.2)
Total Protein: 6.9 g/dL (ref 6.1–8.1)
eGFR: 113 mL/min/{1.73_m2} (ref >=60)

## 2021-12-25 LAB — TSH: TSH: 1.5 mIU/L

## 2022-01-05 NOTE — Telephone Encounter (Signed)
MyChart messgae sent to patient. 

## 2022-02-08 ENCOUNTER — Telehealth: Payer: Self-pay | Admitting: Internal Medicine

## 2022-02-08 NOTE — Telephone Encounter (Signed)
Pt mom came in to drop off a letter for a PA for ozempic. Placed in provider folder

## 2022-02-10 NOTE — Telephone Encounter (Signed)
PA for Ozempic is needed.

## 2022-02-11 ENCOUNTER — Other Ambulatory Visit (HOSPITAL_COMMUNITY): Payer: Self-pay

## 2022-02-11 NOTE — Telephone Encounter (Signed)
Pharmacy Patient Advocate Encounter   Received notification that prior authorization for Ozempic '2mg'$ /66m is required/requested.  Per Test Claim: Prior auth required   PA submitted on 02/11/22 to (ins) [employer] Medicare Part D via CoverMyMeds Key [id number] Status is pending

## 2022-02-11 NOTE — Telephone Encounter (Signed)
noted 

## 2022-03-04 ENCOUNTER — Encounter: Payer: Self-pay | Admitting: Internal Medicine

## 2022-03-05 MED ORDER — SEMAGLUTIDE-WEIGHT MANAGEMENT 0.25 MG/0.5ML ~~LOC~~ SOAJ: 0.2500 mg | SUBCUTANEOUS | 2 refills | Status: AC

## 2022-03-07 NOTE — Telephone Encounter (Signed)
PA for [facility] needed.

## 2022-03-13 ENCOUNTER — Encounter: Payer: Self-pay | Admitting: Internal Medicine

## 2022-03-14 ENCOUNTER — Other Ambulatory Visit: Payer: Self-pay

## 2022-03-14 MED ORDER — HYDROXYZINE HCL 25 MG PO TABS: ORAL_TABLET | ORAL | 1 refills | Status: DC

## 2022-03-17 ENCOUNTER — Other Ambulatory Visit (HOSPITAL_COMMUNITY): Payer: Self-pay

## 2022-03-21 ENCOUNTER — Other Ambulatory Visit (HOSPITAL_COMMUNITY): Payer: Self-pay

## 2022-03-22 ENCOUNTER — Encounter: Payer: Self-pay | Admitting: Internal Medicine

## 2022-03-24 ENCOUNTER — Other Ambulatory Visit (HOSPITAL_COMMUNITY): Payer: Self-pay

## 2022-03-24 NOTE — Telephone Encounter (Signed)
Pt has medicare Part D. Medicare Part D will not cover any weight loss drugs.

## 2022-03-28 ENCOUNTER — Other Ambulatory Visit: Payer: Self-pay | Admitting: Internal Medicine

## 2022-04-01 ENCOUNTER — Other Ambulatory Visit (HOSPITAL_COMMUNITY): Payer: Self-pay

## 2022-04-01 NOTE — Telephone Encounter (Signed)
This request has already been handled and documented in February.

## 2022-05-02 ENCOUNTER — Encounter: Payer: Medicare Other | Admitting: Dermatology

## 2022-05-03 ENCOUNTER — Other Ambulatory Visit: Payer: Self-pay | Admitting: Internal Medicine

## 2022-05-10 ENCOUNTER — Ambulatory Visit (INDEPENDENT_AMBULATORY_CARE_PROVIDER_SITE_OTHER): Payer: 59 | Admitting: Dermatology

## 2022-05-10 VITALS — BP 108/65 | HR 66

## 2022-05-10 DIAGNOSIS — D1801 Hemangioma of skin and subcutaneous tissue: Secondary | ICD-10-CM

## 2022-05-10 DIAGNOSIS — L821 Other seborrheic keratosis: Secondary | ICD-10-CM | POA: Diagnosis not present

## 2022-05-10 DIAGNOSIS — D225 Melanocytic nevi of trunk: Secondary | ICD-10-CM

## 2022-05-10 DIAGNOSIS — D229 Melanocytic nevi, unspecified: Secondary | ICD-10-CM

## 2022-05-10 DIAGNOSIS — L811 Chloasma: Secondary | ICD-10-CM | POA: Diagnosis not present

## 2022-05-10 DIAGNOSIS — Z1283 Encounter for screening for malignant neoplasm of skin: Secondary | ICD-10-CM | POA: Diagnosis not present

## 2022-05-10 DIAGNOSIS — L814 Other melanin hyperpigmentation: Secondary | ICD-10-CM

## 2022-05-10 DIAGNOSIS — D2272 Melanocytic nevi of left lower limb, including hip: Secondary | ICD-10-CM | POA: Diagnosis not present

## 2022-05-10 DIAGNOSIS — L7211 Pilar cyst: Secondary | ICD-10-CM | POA: Diagnosis not present

## 2022-05-10 DIAGNOSIS — L578 Other skin changes due to chronic exposure to nonionizing radiation: Secondary | ICD-10-CM

## 2022-05-10 MED ORDER — HYDROQUINONE 4 % EX CREA: TOPICAL_CREAM | Freq: Two times a day (BID) | CUTANEOUS | 2 refills | Status: AC

## 2022-05-10 NOTE — Patient Instructions (Addendum)
Restart hydroquinone 4% cream - Apply to dark patches on face one to two times a day for up to 3 months at a time.   Recommend OTC Inkey Tranexamic Acid cream to apply to hyperpigmented areas on face at night.  Can buy at [facility], or online for around $15. May use while taking a break from hydroquinone cream.      Pre-Operative Instructions  You are scheduled for a surgical procedure at [facility]. We recommend you read the following instructions. If you have any questions or concerns, please call the office at [phone].  Shower and wash the entire body with soap and water the day of your surgery paying special attention to cleansing at and around the planned surgery site.  Avoid aspirin or aspirin containing products at least fourteen (14) days prior to your surgical procedure and for at least one week (7 Days) after your surgical procedure. If you take aspirin on a regular basis for heart disease or history of stroke or for any other reason, we may recommend you continue taking aspirin but please notify us if you take this on a regular basis. Aspirin can cause more bleeding to occur during surgery as well as prolonged bleeding and bruising after surgery.   Avoid other nonsteroidal pain medications at least one week prior to surgery and at least one week prior to your surgery. These include medications such as Ibuprofen (Motrin, Advil and Nuprin), Naprosyn, Voltaren, Relafen, etc. If medications are used for therapeutic reasons, please inform us as they can cause increased bleeding or prolonged bleeding during and bruising after surgical procedures.   Please advise [place] if you are taking any "blood thinner" medications such as Coumadin or Dipyridamole or Plavix or similar medications. These cause increased bleeding and prolonged bleeding during procedures and bruising after surgical procedures. We may have to consider discontinuing these medications briefly prior to and shortly  after your surgery if safe to do so.   Please inform us of all medications you are currently taking. All medications that are taken regularly should be taken the day of surgery as you always do. Nevertheless, we need to be informed of what medications you are taking prior to surgery to know whether they will affect the procedure or cause any complications.   Please inform us of any medication allergies. Also inform us of whether you have allergies to Latex or rubber products or whether you have had any adverse reaction to Lidocaine or Epinephrine.  Please inform us of any prosthetic or artificial body parts such as artificial heart valve, joint replacements, etc., or similar condition that might require preoperative antibiotics.   We recommend avoidance of alcohol at least two weeks prior to surgery and continued avoidance for at least two weeks after surgery.   We recommend discontinuation of tobacco smoking at least two weeks prior to surgery and continued abstinence for at least two weeks after surgery.  Do not plan strenuous exercise, strenuous work or strenuous lifting for approximately four weeks after your surgery.   We request if you are unable to make your scheduled surgical appointment, please call us at least a week in advance or as soon as you are aware of a problem so that we can cancel or reschedule the appointment.   You MAY TAKE TYLENOL (acetaminophen) for pain as it is not a blood thinner.   PLEASE PLAN TO BE IN TOWN FOR TWO WEEKS FOLLOWING SURGERY, THIS IS IMPORTANT SO YOU CAN BE CHECKED FOR  DRESSING CHANGES, SUTURE REMOVAL AND TO MONITOR FOR POSSIBLE COMPLICATIONS.   Melanoma ABCDEs  Melanoma is the most dangerous type of skin cancer, and is the leading cause of death from skin disease.  You are more likely to develop melanoma if you: Have light-colored skin, light-colored eyes, or red or blond hair Spend a lot of time in the sun Tan regularly, either outdoors or in a  tanning bed Have had blistering sunburns, especially during childhood Have a close family member who has had a melanoma Have atypical moles or large birthmarks  Early detection of melanoma is key since treatment is typically straightforward and cure rates are extremely high if we catch it early.   The first sign of melanoma is often a change in a mole or a new dark spot.  The ABCDE system is a way of remembering the signs of melanoma.  A for asymmetry:  The two halves do not match. B for border:  The edges of the growth are irregular. C for color:  A mixture of colors are present instead of an even brown color. D for diameter:  Melanomas are usually (but not always) greater than 6mm - the size of a pencil eraser. E for evolution:  The spot keeps changing in size, shape, and color.  Please check your skin once per month between visits. You can use a small mirror in front and a large mirror behind you to keep an eye on the back side or your body.   If you see any new or changing lesions before your next follow-up, please call to schedule a visit.  Please continue daily skin protection including broad spectrum sunscreen SPF 30+ to sun-exposed areas, reapplying every 2 hours as needed when you're outdoors.   Staying in the shade or wearing long sleeves, sun glasses (UVA+UVB protection) and wide brim hats (4-inch brim around the entire circumference of the hat) are also recommended for sun protection.    Due to recent changes in healthcare laws, you may see results of your pathology and/or laboratory studies on MyChart before the doctors have had a chance to review them. We understand that in some cases there may be results that are confusing or concerning to you. Please understand that not all results are received at the same time and often the doctors may need to interpret multiple results in order to provide you with the best plan of care or course of treatment. Therefore, we ask that you please  give [place] 2 business days to thoroughly review all your results before contacting the office for clarification. Should we see a critical lab result, you will be contacted sooner.   If You Need Anything After Your Visit  If you have any questions or concerns for your doctor, please call our main line at [phone] and press option 4 to reach your doctor's medical assistant. If no one answers, please leave a voicemail as directed and we will return your call as soon as possible. Messages left after 4 pm will be answered the following business day.   You may also send [place] a message via MyChart. We typically respond to MyChart messages within 1-2 business days.  For prescription refills, please ask your pharmacy to contact our office. Our fax number is [phone].  If you have an urgent issue when the clinic is closed that cannot wait until the next business day, you can page your doctor at the number below.    Please note that while we do  our best to be available for urgent issues outside of office hours, we are not available 24/7.   If you have an urgent issue and are unable to reach [place], you may choose to seek medical care at your doctor's office, retail clinic, urgent care center, or emergency room.  If you have a medical emergency, please immediately call 911 or go to the emergency department.  Pager Numbers  - Dr. Gwen Pounds: [phone]  - Dr. Neale Burly: [phone]  - Dr. Roseanne Reno: [phone]  In the event of inclement weather, please call our main line at [phone] for an update on the status of any delays or closures.  Dermatology Medication Tips: Please keep the boxes that topical medications come in in order to help keep track of the instructions about where and how to use these. Pharmacies typically print the medication instructions only on the boxes and not directly on the medication tubes.   If your medication is too expensive, please contact our office at [phone]  option 4 or send [place] a message through MyChart.   We are unable to tell what your co-pay for medications will be in advance as this is different depending on your insurance coverage. However, we may be able to find a substitute medication at lower cost or fill out paperwork to get insurance to cover a needed medication.   If a prior authorization is required to get your medication covered by your insurance company, please allow [place] 1-2 business days to complete this process.  Drug prices often vary depending on where the prescription is filled and some pharmacies may offer cheaper prices.  The website www.goodrx.com contains coupons for medications through different pharmacies. The prices here do not account for what the cost may be with help from insurance (it may be cheaper with your insurance), but the website can give you the price if you did not use any insurance.  - You can print the associated coupon and take it with your prescription to the pharmacy.  - You may also stop by our office during regular business hours and pick up a GoodRx coupon card.  - If you need your prescription sent electronically to a different pharmacy, notify our office through [facility] or by phone at [phone] option 4.     Si Usted Necesita Algo Despus de Su Visita  Tambin puede enviarnos un mensaje a travs de Clinical cytogeneticist. Por lo general respondemos a los mensajes de MyChart en el transcurso de 1 a 2 das hbiles.  Para renovar recetas, por favor pida a su farmacia que se ponga en contacto con nuestra oficina. Annie Sable de fax es [place] [phone].  Si tiene un asunto urgente cuando la clnica est cerrada y que no puede esperar hasta el siguiente da hbil, puede llamar/localizar a su doctor(a) al nmero que aparece a continuacin.   Por favor, tenga en cuenta que aunque hacemos todo lo posible para estar disponibles para asuntos urgentes fuera del horario de [place], no estamos disponibles las  24 horas del da, los 7 [address]  [address] de la [place].   Si tiene un problema urgente y no puede comunicarse con nosotros, puede optar por buscar atencin mdica  en el consultorio de su doctor(a), en una clnica privada, en un centro de atencin urgente o en una sala de emergencias.  Si tiene Engineer, drilling, por favor llame inmediatamente al 911 o vaya a la sala de emergencias.  Nmeros de bper  - Dr. Gwen Pounds: [phone]  - Dra. Moye: [phone]  -  Rockne Menghini: [phone]  En caso de inclemencias del [place], por favor llame a nuestra lnea principal al [phone] para una actualizacin sobre el [place] de cualquier retraso o cierre.  Consejos para la medicacin en dermatologa: Por favor, guarde las cajas en las que vienen los medicamentos de uso tpico para ayudarle a seguir las instrucciones sobre dnde y cmo usarlos. Las farmacias generalmente imprimen las instrucciones del medicamento slo en las cajas y no directamente en los tubos del [place].   Si su medicamento es muy caro, por favor, pngase en contacto con Rolm [place] llamando al [phone] y presione la opcin 4 o envenos un mensaje a travs de Clinical cytogeneticist.   No podemos decirle cul ser su copago por los medicamentos por adelantado ya que esto es diferente dependiendo de la cobertura de su seguro. Sin embargo, es posible que podamos encontrar un medicamento sustituto a Audiological scientist un formulario para que el seguro cubra el medicamento que se considera necesario.   Si se requiere una autorizacin previa para que su compaa de seguros [place] su medicamento, por favor permtanos de 1 a 2 das hbiles para completar [address]  Los precios de los medicamentos varan con frecuencia dependiendo del Environmental consultant de dnde se surte la receta y alguna farmacias pueden ofrecer precios ms baratos.  El sitio web www.goodrx.com tiene cupones para medicamentos de Health and safety inspector. Los precios aqu no tienen en cuenta lo  que podra costar con la ayuda del seguro (puede ser ms barato con su seguro), pero el sitio web puede darle el precio si no utiliz Tourist information centre manager.  - Puede imprimir el cupn correspondiente y llevarlo con su receta a la farmacia.  - Tambin puede pasar por nuestra oficina durante el horario de atencin regular y Education officer, museum una tarjeta de cupones de GoodRx.  - Si necesita que su receta se enve electrnicamente a una farmacia diferente, informe a nuestra oficina a travs de MyChart de [place] o por telfono llamando al [phone] y presione la opcin 4.

## 2022-05-10 NOTE — Progress Notes (Signed)
Follow-Up Visit   Subjective  Ashlee Graves is a 37 y.o. female who presents for the following: Skin Cancer Screening and Full Body Skin Exam  The patient presents for Total-Body Skin Exam (TBSE) for skin cancer screening and mole check. The patient has spots, moles and lesions to be evaluated, some may be new or changing.   Patient accompanied by mother.  The following portions of the chart were reviewed this encounter and updated as appropriate: medications, allergies, medical history  Review of Systems:  No other skin or systemic complaints except as noted in HPI or Assessment and Plan.  Objective  Well appearing patient in no apparent distress; mood and affect are within normal limits.  A full examination was performed including scalp, head, eyes, ears, nose, lips, neck, chest, axillae, abdomen, back, buttocks, bilateral upper extremities, bilateral lower extremities, hands, feet, fingers, toes, fingernails, and toenails. All findings within normal limits unless otherwise noted below.   Relevant physical exam findings are noted in the Assessment and Plan.    Assessment & Plan  SKIN CANCER SCREENING PERFORMED TODAY.  LENTIGINES, SEBORRHEIC KERATOSES, HEMANGIOMAS - Benign normal skin lesions - Benign-appearing - Call for any changes  MELANOCYTIC NEVI - Tan-brown and/or pink-flesh-colored symmetric macules and papules Photos compared from 04/06/2020, stable.  L lower pretibial 7.0 x 6.54mm med light brown papule within scar   Right Lower Back Brown macules within 1.0 cm scar, c/w recurrent nevus, see photo   L spinal mid back 5.0 x 3.6mm med brown macule with darker center   L spinal lower back 4.23mm med brown macule with notch   Right Abdomen  4.0 x 3.50mm med brown macule with notch  - Benign appearing on exam today - Observation - Call clinic for new or changing moles - Recommend daily use of broad spectrum spf 30+ sunscreen to sun-exposed areas.   ACTINIC  DAMAGE - Chronic condition, secondary to cumulative UV/sun exposure - diffuse scaly erythematous macules with underlying dyspigmentation - Recommend daily broad spectrum sunscreen SPF 30+ to sun-exposed areas, reapply every 2 hours as needed.  - Staying in the shade or wearing long sleeves, sun glasses (UVA+UVB protection) and wide brim hats (4-inch brim around the entire circumference of the hat) are also recommended for sun protection.  - Call for new or changing lesions.  Pilar Cyst Exam: Firm subcutaneous nodule of the right parietal scalp above ear, 1.3cm  Benign-appearing. Exam most consistent with a pilar cyst. Discussed that a cyst is a benign growth that can grow over time and sometimes get irritated or inflamed. Recommend observation if it is not bothersome. Discussed option of surgical excision to remove it if it is growing, symptomatic, or other changes noted. Please call for new or changing lesions so they can be evaluated.  Cyst with symptoms and/or recent change.  Discussed surgical excision to remove, including resulting scar and possible recurrence.  Patient will schedule for surgery. Pre-op information given.   MELASMA Exam: reticulated hyperpigmented patches at forehead  Chronic and persistent condition with duration or expected duration over one year. Condition is symptomatic/ bothersome to patient. Not currently at goal.  Melasma is a chronic; persistent condition of hyperpigmented patches generally on the face, worse in summer due to higher UV exposure.    Heredity; thyroid disease; sun exposure; pregnancy; birth control pills; epilepsy medication and darker skin may predispose to Melasma.   Recommendations include: - Sun avoidance and daily broad spectrum (UVA/UVB) tinted mineral sunscreen SPF 30+, with Zinc or  Titanium Dioxide. - Rx topical bleaching creams (i.e. hydroquinone) is a common treatment but should not be used long term.  Hydroquinones may be mixed with  retinoids; vitamin C; steroids; Kojic Acid. - Alastin A-luminate, retinoids, vitamin C, topical tranexamic acid, glycolic acid and kojic acid can be used for brightening while on break from hydroquinone - Rx Azelaic Acid is also a treatment option that is safe for pregnancy (Category B). - OTC Heliocare can be helpful in control and prevention. - Oral Rx with Tranexamic Acid 250 mg - 650 mg po daily can be used for moderate to severe cases especially during summer (contraindications include pregnancy; lactation; hx of PE; hx of DVT; clotting disorder; heart disease; anticoagulant use and upcoming long trips)   - Chemical peels (would need multiple for best result).  - Lasers and  Microdermabrasion may also be helpful adjunct treatments.  Treatment Plan: Continue Hydroquinone 4% Cream Apply to face QD/BID for dark spots for up to 3 months. Recommend OTC Inkey Tranexamic Acid cream to apply to hyperpigmented areas on face at night.  Can buy at [facility], or online for around $15. Continue spf 30+ sunscreen with zinc qam  Melasma  Related Medications hydroquinone 4 % cream Apply topically 2 (two) times daily. To dark patches at face for up to 3 months.   Return in about 1 year (around 05/10/2023) for TBSE, also next available for scalpl cyst surgery.  ICherlyn Labella, CMA, am acting as scribe for Willeen Niece, MD .   Documentation: I have reviewed the above documentation for accuracy and completeness, and I agree with the above.  Willeen Niece, MD

## 2022-06-06 ENCOUNTER — Ambulatory Visit (INDEPENDENT_AMBULATORY_CARE_PROVIDER_SITE_OTHER): Payer: 59 | Admitting: Dermatology

## 2022-06-06 VITALS — BP 121/72 | HR 57

## 2022-06-06 DIAGNOSIS — D485 Neoplasm of uncertain behavior of skin: Secondary | ICD-10-CM

## 2022-06-06 DIAGNOSIS — L7211 Pilar cyst: Secondary | ICD-10-CM

## 2022-06-06 NOTE — Progress Notes (Signed)
Follow-Up Visit   Subjective  Ashlee Graves is a 37 y.o. female who presents for the following: Procedure (Cyst excision of the right parietal scalp above ear. Patient also has similar spot on the right temporal scalp. ). And right temporal scalp.   The following portions of the chart were reviewed this encounter and updated as appropriate:       Review of Systems:  No other skin or systemic complaints except as noted in HPI or Assessment and Plan.  Objective  Well appearing patient in no apparent distress; mood and affect are within normal limits.  A focused examination was performed including face, scalp. Relevant physical exam findings are noted in the Assessment and Plan.  Right Parietal Scalp Above Ear Firm subcutaneous nodule, 1.5 CM  Right Temporal scalp Firm subcutaneous nodule, 1.5 CM    Assessment & Plan  Neoplasm of uncertain behavior of skin (2) Right Parietal Scalp Above Ear  Skin excision  Lesion length (cm):  1.5 Lesion width (cm):  1.5 Margin per side (cm):  0.1 Total excision diameter (cm):  1.7 Informed consent: discussed and consent obtained   Timeout: patient name, date of birth, surgical site, and procedure verified   Procedure prep:  Patient was prepped and draped in usual sterile fashion Prep type:  Povidone-iodine Anesthesia: the lesion was anesthetized in a standard fashion   Anesthetic:  1% lidocaine w/ epinephrine 1-100,000 buffered w/ 8.4% NaHCO3 (Total 6cc - 3cc lido w/epi, 3cc bupivacaine) Instrument used: #15 blade   Hemostasis achieved with: pressure and electrodesiccation   Outcome: patient tolerated procedure well with no complications    Skin repair Complexity:  Intermediate Final length (cm):  1.5 Informed consent: discussed and consent obtained   Reason for type of repair: reduce tension to allow closure, reduce the risk of dehiscence, infection, and necrosis, reduce subcutaneous dead space and avoid a hematoma, preserve normal  anatomical and functional relationships and enhance both functionality and cosmetic results   Undermining: edges undermined   Subcutaneous layers (deep stitches):  Suture size:  3-0 Suture type: Vicryl (polyglactin 910)   Stitches:  Buried vertical mattress Fine/surface layer approximation (top stitches):  Suture size:  4-0 Suture type: Prolene (polypropylene)   Stitches: simple interrupted   Suture removal (days):  7 Hemostasis achieved with: suture Outcome: patient tolerated procedure well with no complications   Post-procedure details: sterile dressing applied and wound care instructions given   Dressing type: pressure dressing (mupirocin)    Specimen 1 - Surgical pathology Differential Diagnosis: Cyst vs other Check Margins: No  Right Temporal scalp  Skin excision  Lesion length (cm):  1.5 Lesion width (cm):  1.5 Margin per side (cm):  0.1 Total excision diameter (cm):  1.7 Informed consent: discussed and consent obtained   Timeout: patient name, date of birth, surgical site, and procedure verified   Procedure prep:  Patient was prepped and draped in usual sterile fashion Prep type:  Povidone-iodine Anesthesia: the lesion was anesthetized in a standard fashion   Anesthetic:  1% lidocaine w/ epinephrine 1-100,000 buffered w/ 8.4% NaHCO3 Instrument used: #15 blade   Hemostasis achieved with: pressure and electrodesiccation   Outcome: patient tolerated procedure well with no complications    Skin repair Complexity:  Intermediate Final length (cm):  1.5 Informed consent: discussed and consent obtained   Reason for type of repair: reduce tension to allow closure, reduce the risk of dehiscence, infection, and necrosis, reduce subcutaneous dead space and avoid a hematoma, preserve normal anatomical and  functional relationships and enhance both functionality and cosmetic results   Undermining: edges undermined   Subcutaneous layers (deep stitches):  Suture size:  3-0 Suture  type: Vicryl (polyglactin 910)   Stitches:  Buried vertical mattress Fine/surface layer approximation (top stitches):  Suture size:  4-0 Suture type: Prolene (polypropylene)   Stitches: simple interrupted   Suture removal (days):  7 Hemostasis achieved with: suture Outcome: patient tolerated procedure well with no complications   Post-procedure details: sterile dressing applied and wound care instructions given   Dressing type: pressure dressing (mupirocin)    Specimen 2 - Surgical pathology Differential Diagnosis: Cyst vs other Check Margins: No   Return in about 1 week (around 06/13/2022) for suture removal x 2.  ICherlyn Labella, CMA, am acting as scribe for Willeen Niece, MD .  Documentation: I have reviewed the above documentation for accuracy and completeness, and I agree with the above.  Willeen Niece MD

## 2022-06-06 NOTE — Patient Instructions (Signed)
Wound Care Instructions for After Surgery  On the day following your surgery, you should begin doing daily dressing changes until your sutures are removed: Remove the bandage. Cleanse the wound gently with soap and water.  Make sure you then dry the skin surrounding the wound completely or the tape will not stick to the skin. Do not use cotton balls on the wound. After the wound is clean and dry, apply the ointment (either prescription antibiotic prescribed by your doctor or plain Vaseline if nothing was prescribed) gently with a Q-tip. If you are using a bandaid to cover: Apply a bandaid large enough to cover the entire wound. If you do not have a bandaid large enough to cover the wound OR if you are sensitive to bandaid adhesive: Cut a non-stick pad (such as Telfa) to fit the size of the wound.  Cover the wound with the non-stick pad. If the wound is draining, you may want to add a small amount of gauze on top of the non-stick pad for a little added compression to the area. Use tape to seal the area completely.  For the next 1-2 weeks: Be sure to keep the wound moist with ointment 24/7 to ensure best healing. If you are unable to cover the wound with a bandage to hold the ointment in place, you may need to reapply the ointment several times a day. Do not bend over or lift heavy items to reduce the chance of elevated blood pressure to the wound. Do not participate in particularly strenuous activities.  Below is a list of dressing supplies you might need.  Cotton-tipped applicators - Q-tips Gauze pads (2x2 and/or 4x4) - All-Purpose Sponges New and clean tube of petroleum jelly (Vaseline) OR prescription antibiotic ointment if prescribed Either a bandaid large enough to cover the entire wound OR non-stick dressing material (Telfa) and Tape (Paper or Hypafix)  FOR ADULT SURGERY PATIENTS: If you need something for pain relief, you may take 1 extra strength Tylenol (acetaminophen) and 2  ibuprofen (200 mg) together every 4 hours as needed. (Do not take these medications if you are allergic to them or if you know you cannot take them for any other reason). Typically you may only need pain medication for 1-3 days.   Comments on the Post-Operative Period Slight swelling and redness often appear around the wound. This is normal and will disappear within several days following the surgery. The healing wound will drain a brownish-red-yellow discharge during healing. This is a normal phase of wound healing. As the wound begins to heal, the drainage may increase in amount. Again, this drainage is normal. Notify us if the drainage becomes persistently bloody, excessively swollen, or intensely painful or develops a foul odor or red streaks.  The healing wound will also typically be itchy. This is normal. If you have severe or persistent pain, Notify us if the discomfort is severe or persistent. Avoid alcoholic beverages when taking pain medicine.  In Case of Wound Hemorrhage A wound hemorrhage is when the bandage suddenly becomes soaked with bright red blood and flows profusely. If this happens, sit down or lie down with your head elevated. If the wound has a dressing on it, do not remove the dressing. Apply pressure to the existing gauze. If the wound is not covered, use a gauze pad to apply pressure and continue applying the pressure for 20 minutes without peeking. DO NOT COVER THE WOUND WITH A LARGE TOWEL OR WASH CLOTH. Release your hand from the   wound site but do not remove the dressing. If the bleeding has stopped, gently clean around the wound. Leave the dressing in place for 24 hours if possible. This wait time allows the blood vessels to close off so that you do not spark a new round of bleeding by disrupting the newly clotted blood vessels with an immediate dressing change. If the bleeding does not subside, continue to hold pressure for 40 minutes. If bleeding continues, page your  physician, contact an After Hours clinic or go to the Emergency Room.   Due to recent changes in healthcare laws, you may see results of your pathology and/or laboratory studies on MyChart before the doctors have had a chance to review them. We understand that in some cases there may be results that are confusing or concerning to you. Please understand that not all results are received at the same time and often the doctors may need to interpret multiple results in order to provide you with the best plan of care or course of treatment. Therefore, we ask that you please give us 2 business days to thoroughly review all your results before contacting the office for clarification. Should we see a critical lab result, you will be contacted sooner.   If You Need Anything After Your Visit  If you have any questions or concerns for your doctor, please call our main line at [phone] and press option 4 to reach your doctor's medical assistant. If no one answers, please leave a voicemail as directed and we will return your call as soon as possible. Messages left after 4 pm will be answered the following business day.   You may also send us a message via MyChart. We typically respond to MyChart messages within 1-2 business days.  For prescription refills, please ask your pharmacy to contact our office. Our fax number is [phone].  If you have an urgent issue when the clinic is closed that cannot wait until the next business day, you can page your doctor at the number below.    Please note that while we do our best to be available for urgent issues outside of office hours, we are not available 24/7.   If you have an urgent issue and are unable to reach us, you may choose to seek medical care at your doctor's office, retail clinic, urgent care center, or emergency room.  If you have a medical emergency, please immediately call 911 or go to the emergency department.  Pager Numbers  - Dr. Kowalski:  [phone]  - Dr. Moye: [phone]  - Dr. Stewart: [phone]  In the event of inclement weather, please call our main line at [phone] for an update on the status of any delays or closures.  Dermatology Medication Tips: Please keep the boxes that topical medications come in in order to help keep track of the instructions about where and how to use these. Pharmacies typically print the medication instructions only on the boxes and not directly on the medication tubes.   If your medication is too expensive, please contact our office at [phone] option 4 or send us a message through MyChart.   We are unable to tell what your co-pay for medications will be in advance as this is different depending on your insurance coverage. However, we may be able to find a substitute medication at lower cost or fill out paperwork to get insurance to cover a needed medication.   If a prior authorization is required to get your medication covered   by your insurance company, please allow us 1-2 business days to complete this process.  Drug prices often vary depending on where the prescription is filled and some pharmacies may offer cheaper prices.  The website www.goodrx.com contains coupons for medications through different pharmacies. The prices here do not account for what the cost may be with help from insurance (it may be cheaper with your insurance), but the website can give you the price if you did not use any insurance.  - You can print the associated coupon and take it with your prescription to the pharmacy.  - You may also stop by our office during regular business hours and pick up a GoodRx coupon card.  - If you need your prescription sent electronically to a different pharmacy, notify our office through [facility] MyChart or by phone at [phone] option 4.     Si Usted Necesita Algo Despus de Su Visita  Tambin puede enviarnos un mensaje a travs de MyChart. Por lo general  respondemos a los mensajes de MyChart en el transcurso de 1 a 2 das hbiles.  Para renovar recetas, por favor pida a su farmacia que se ponga en contacto con nuestra oficina. Nuestro nmero de fax es el [phone].  Si tiene un asunto urgente cuando la clnica est cerrada y que no puede esperar hasta el siguiente da hbil, puede llamar/localizar a su doctor(a) al nmero que aparece a continuacin.   Por favor, tenga en cuenta que aunque hacemos todo lo posible para estar disponibles para asuntos urgentes fuera del horario de oficina, no estamos disponibles las 24 horas del da, los 7 das de la semana.   Si tiene un problema urgente y no puede comunicarse con nosotros, puede optar por buscar atencin mdica  en el consultorio de su doctor(a), en una clnica privada, en un centro de atencin urgente o en una sala de emergencias.  Si tiene una emergencia mdica, por favor llame inmediatamente al 911 o vaya a la sala de emergencias.  Nmeros de bper  - Dr. Kowalski: [phone]  - Dra. Moye: [phone]  - Dra. Stewart: [phone]  En caso de inclemencias del tiempo, por favor llame a nuestra lnea principal al [phone] para una actualizacin sobre el estado de cualquier retraso o cierre.  Consejos para la medicacin en dermatologa: Por favor, guarde las cajas en las que vienen los medicamentos de uso tpico para ayudarle a seguir las instrucciones sobre dnde y cmo usarlos. Las farmacias generalmente imprimen las instrucciones del medicamento slo en las cajas y no directamente en los tubos del medicamento.   Si su medicamento es muy caro, por favor, pngase en contacto con nuestra oficina llamando al [phone] y presione la opcin 4 o envenos un mensaje a travs de MyChart.   No podemos decirle cul ser su copago por los medicamentos por adelantado ya que esto es diferente dependiendo de la cobertura de su seguro. Sin embargo, es posible que podamos encontrar un  medicamento sustituto a menor costo o llenar un formulario para que el seguro cubra el medicamento que se considera necesario.   Si se requiere una autorizacin previa para que su compaa de seguros cubra su medicamento, por favor permtanos de 1 a 2 das hbiles para completar este proceso.  Los precios de los medicamentos varan con frecuencia dependiendo del lugar de dnde se surte la receta y alguna farmacias pueden ofrecer precios ms baratos.  El sitio web www.goodrx.com tiene cupones para medicamentos de diferentes farmacias. Los precios aqu no   tienen en cuenta lo que podra costar con la ayuda del seguro (puede ser ms barato con su seguro), pero el sitio web puede darle el precio si no utiliz ningn seguro.  - Puede imprimir el cupn correspondiente y llevarlo con su receta a la farmacia.  - Tambin puede pasar por nuestra oficina durante el horario de atencin regular y recoger una tarjeta de cupones de GoodRx.  - Si necesita que su receta se enve electrnicamente a una farmacia diferente, informe a nuestra oficina a travs de MyChart de [place] o por telfono llamando al [phone] y presione la opcin 4.   

## 2022-06-07 ENCOUNTER — Telehealth: Payer: Self-pay

## 2022-06-07 NOTE — Telephone Encounter (Signed)
Talked to patient's mother, Toniann Fail, and patient is doing fine after yesterday's surgery. She will call back with any problems of concerns.

## 2022-06-13 ENCOUNTER — Ambulatory Visit (INDEPENDENT_AMBULATORY_CARE_PROVIDER_SITE_OTHER): Payer: 59 | Admitting: Dermatology

## 2022-06-13 DIAGNOSIS — L7211 Pilar cyst: Secondary | ICD-10-CM

## 2022-06-13 NOTE — Progress Notes (Signed)
   Follow-Up Visit   Subjective  Ashlee Graves is a 37 y.o. 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  The following portions of the chart were reviewed this encounter and updated as appropriate: medications, allergies, medical history  Review of Systems:  No other skin or systemic complaints except as noted in HPI or Assessment and Plan.  Objective  Well appearing patient in no apparent distress; mood and affect are within normal limits.  Areas Examined: Scalp  Relevant physical exam findings are noted in the Assessment and Plan.    Assessment & Plan    Encounter for Removal of Sutures - Incision site is clean, dry and intact x 2. - Wound cleansed, sutures removed, wound cleansed and steri strips applied.  - Discussed pathology results showing Pilar Cysts x 2, Benign - Scars remodel for a full year. - Patient advised to call with any concerns or if they notice any new or changing lesions.  Return as scheduled, for TBSE.  ICherlyn Labella, CMA, am acting as scribe for Willeen Niece, MD .   Documentation: I have reviewed the above documentation for accuracy and completeness, and I agree with the above.  Willeen Niece, MD

## 2022-06-13 NOTE — Patient Instructions (Signed)
Due to recent changes in healthcare laws, you may see results of your pathology and/or laboratory studies on MyChart before the doctors have had a chance to review them. We understand that in some cases there may be results that are confusing or concerning to you. Please understand that not all results are received at the same time and often the doctors may need to interpret multiple results in order to provide you with the best plan of care or course of treatment. Therefore, we ask that you please give us 2 business days to thoroughly review all your results before contacting the office for clarification. Should we see a critical lab result, you will be contacted sooner.   If You Need Anything After Your Visit  If you have any questions or concerns for your doctor, please call our main line at [phone] and press option 4 to reach your doctor's medical assistant. If no one answers, please leave a voicemail as directed and we will return your call as soon as possible. Messages left after 4 pm will be answered the following business day.   You may also send us a message via MyChart. We typically respond to MyChart messages within 1-2 business days.  For prescription refills, please ask your pharmacy to contact our office. Our fax number is [phone].  If you have an urgent issue when the clinic is closed that cannot wait until the next business day, you can page your doctor at the number below.    Please note that while we do our best to be available for urgent issues outside of office hours, we are not available 24/7.   If you have an urgent issue and are unable to reach us, you may choose to seek medical care at your doctor's office, retail clinic, urgent care center, or emergency room.  If you have a medical emergency, please immediately call 911 or go to the emergency department.  Pager Numbers  - Dr. Kowalski: [phone]  - Dr. Moye: [phone]  - Dr. Stewart:  [phone]  In the event of inclement weather, please call our main line at [phone] for an update on the status of any delays or closures.  Dermatology Medication Tips: Please keep the boxes that topical medications come in in order to help keep track of the instructions about where and how to use these. Pharmacies typically print the medication instructions only on the boxes and not directly on the medication tubes.   If your medication is too expensive, please contact our office at [phone] option 4 or send us a message through MyChart.   We are unable to tell what your co-pay for medications will be in advance as this is different depending on your insurance coverage. However, we may be able to find a substitute medication at lower cost or fill out paperwork to get insurance to cover a needed medication.   If a prior authorization is required to get your medication covered by your insurance company, please allow us 1-2 business days to complete this process.  Drug prices often vary depending on where the prescription is filled and some pharmacies may offer cheaper prices.  The website www.goodrx.com contains coupons for medications through different pharmacies. The prices here do not account for what the cost may be with help from insurance (it may be cheaper with your insurance), but the website can give you the price if you did not use any insurance.  - You can print the associated coupon and take it with   your prescription to the pharmacy.  - You may also stop by our office during regular business hours and pick up a GoodRx coupon card.  - If you need your prescription sent electronically to a different pharmacy, notify our office through [facility] MyChart or by phone at [phone] option 4.     Si Usted Necesita Algo Despus de Su Visita  Tambin puede enviarnos un mensaje a travs de MyChart. Por lo general respondemos a los mensajes de MyChart en el transcurso de 1 a 2  das hbiles.  Para renovar recetas, por favor pida a su farmacia que se ponga en contacto con nuestra oficina. Nuestro nmero de fax es el [phone].  Si tiene un asunto urgente cuando la clnica est cerrada y que no puede esperar hasta el siguiente da hbil, puede llamar/localizar a su doctor(a) al nmero que aparece a continuacin.   Por favor, tenga en cuenta que aunque hacemos todo lo posible para estar disponibles para asuntos urgentes fuera del horario de oficina, no estamos disponibles las 24 horas del da, los 7 das de la semana.   Si tiene un problema urgente y no puede comunicarse con nosotros, puede optar por buscar atencin mdica  en el consultorio de su doctor(a), en una clnica privada, en un centro de atencin urgente o en una sala de emergencias.  Si tiene una emergencia mdica, por favor llame inmediatamente al 911 o vaya a la sala de emergencias.  Nmeros de bper  - Dr. Kowalski: [phone]  - Dra. Moye: [phone]  - Dra. Stewart: [phone]  En caso de inclemencias del tiempo, por favor llame a nuestra lnea principal al [phone] para una actualizacin sobre el estado de cualquier retraso o cierre.  Consejos para la medicacin en dermatologa: Por favor, guarde las cajas en las que vienen los medicamentos de uso tpico para ayudarle a seguir las instrucciones sobre dnde y cmo usarlos. Las farmacias generalmente imprimen las instrucciones del medicamento slo en las cajas y no directamente en los tubos del medicamento.   Si su medicamento es muy caro, por favor, pngase en contacto con nuestra oficina llamando al [phone] y presione la opcin 4 o envenos un mensaje a travs de MyChart.   No podemos decirle cul ser su copago por los medicamentos por adelantado ya que esto es diferente dependiendo de la cobertura de su seguro. Sin embargo, es posible que podamos encontrar un medicamento sustituto a menor costo o llenar un formulario para que el  seguro cubra el medicamento que se considera necesario.   Si se requiere una autorizacin previa para que su compaa de seguros cubra su medicamento, por favor permtanos de 1 a 2 das hbiles para completar este proceso.  Los precios de los medicamentos varan con frecuencia dependiendo del lugar de dnde se surte la receta y alguna farmacias pueden ofrecer precios ms baratos.  El sitio web www.goodrx.com tiene cupones para medicamentos de diferentes farmacias. Los precios aqu no tienen en cuenta lo que podra costar con la ayuda del seguro (puede ser ms barato con su seguro), pero el sitio web puede darle el precio si no utiliz ningn seguro.  - Puede imprimir el cupn correspondiente y llevarlo con su receta a la farmacia.  - Tambin puede pasar por nuestra oficina durante el horario de atencin regular y recoger una tarjeta de cupones de GoodRx.  - Si necesita que su receta se enve electrnicamente a una farmacia diferente, informe a nuestra oficina a travs de MyChart de [place]   o por telfono llamando al [phone] y presione la opcin 4.  

## 2022-06-27 ENCOUNTER — Ambulatory Visit (INDEPENDENT_AMBULATORY_CARE_PROVIDER_SITE_OTHER): Payer: 59 | Admitting: Internal Medicine

## 2022-06-27 ENCOUNTER — Encounter: Payer: Self-pay | Admitting: Internal Medicine

## 2022-06-27 VITALS — BP 104/76 | HR 65 | Temp 98.1°F | Ht 63.0 in | Wt 164.4 lb

## 2022-06-27 DIAGNOSIS — E663 Overweight: Secondary | ICD-10-CM | POA: Diagnosis not present

## 2022-06-27 DIAGNOSIS — J309 Allergic rhinitis, unspecified: Secondary | ICD-10-CM | POA: Diagnosis not present

## 2022-06-27 DIAGNOSIS — I10 Essential (primary) hypertension: Secondary | ICD-10-CM | POA: Diagnosis not present

## 2022-06-27 DIAGNOSIS — R748 Abnormal levels of other serum enzymes: Secondary | ICD-10-CM

## 2022-06-27 DIAGNOSIS — R7301 Impaired fasting glucose: Secondary | ICD-10-CM

## 2022-06-27 DIAGNOSIS — F509 Eating disorder, unspecified: Secondary | ICD-10-CM

## 2022-06-27 DIAGNOSIS — Z6829 Body mass index (BMI) 29.0-29.9, adult: Secondary | ICD-10-CM

## 2022-06-27 LAB — COMPREHENSIVE METABOLIC PANEL
ALT: 20 U/L (ref 0–35)
AST: 68 U/L — ABNORMAL HIGH (ref 0–37)
Albumin: 3.5 g/dL (ref 3.5–5.2)
Alkaline Phosphatase: 36 U/L — ABNORMAL LOW (ref 39–117)
BUN: 10 mg/dL (ref 6–23)
CO2: 27 mEq/L (ref 19–32)
Calcium: 8.5 mg/dL (ref 8.4–10.5)
Chloride: 103 mEq/L (ref 96–112)
Creatinine, Ser: 0.69 mg/dL (ref 0.40–1.20)
GFR: 109.41 mL/min (ref >60.00)
Glucose, Bld: 90 mg/dL (ref 70–99)
Potassium: 4.7 mEq/L (ref 3.5–5.1)
Sodium: 135 mEq/L (ref 135–145)
Total Bilirubin: 0.3 mg/dL (ref 0.2–1.2)
Total Protein: 6.7 g/dL (ref 6.0–8.3)

## 2022-06-27 LAB — CBC WITH DIFFERENTIAL/PLATELET
Basophils Absolute: 0 10*3/uL (ref 0.0–0.1)
Basophils Relative: 0.6 % (ref 0.0–3.0)
Eosinophils Absolute: 0.3 10*3/uL (ref 0.0–0.7)
Eosinophils Relative: 4.1 % (ref 0.0–5.0)
HCT: 38.4 % (ref 36.0–46.0)
Hemoglobin: 12.5 g/dL (ref 12.0–15.0)
Lymphocytes Relative: 38.6 % (ref 12.0–46.0)
Lymphs Abs: 2.9 10*3/uL (ref 0.7–4.0)
MCHC: 32.5 g/dL (ref 30.0–36.0)
MCV: 88.2 fl (ref 78.0–100.0)
Monocytes Absolute: 0.6 10*3/uL (ref 0.1–1.0)
Monocytes Relative: 8.2 % (ref 3.0–12.0)
Neutro Abs: 3.7 10*3/uL (ref 1.4–7.7)
Neutrophils Relative %: 48.5 % (ref 43.0–77.0)
Platelets: 320 10*3/uL (ref 150.0–400.0)
RBC: 4.35 Mil/uL (ref 3.87–5.11)
RDW: 13.5 % (ref 11.5–15.5)
WBC: 7.6 10*3/uL (ref 4.0–10.5)

## 2022-06-27 LAB — LIPID PANEL
Cholesterol: 170 mg/dL (ref 0–200)
HDL: 46 mg/dL (ref >39.00)
LDL Cholesterol: 100 mg/dL — ABNORMAL HIGH (ref 0–99)
NonHDL: 124.05
Total CHOL/HDL Ratio: 4
Triglycerides: 118 mg/dL (ref 0.0–149.0)
VLDL: 23.6 mg/dL (ref 0.0–40.0)

## 2022-06-27 LAB — LDL CHOLESTEROL, DIRECT: Direct LDL: 125 mg/dL

## 2022-06-27 LAB — HEMOGLOBIN A1C: Hgb A1c MFr Bld: 5.3 % (ref 4.6–6.5)

## 2022-06-27 MED ORDER — FLUTICASONE PROPIONATE 50 MCG/ACT NA SUSP
2.0000 | Freq: Every day | NASAL | 6 refills | Status: DC
Start: 2022-06-27 — End: 2023-08-24

## 2022-06-27 MED ORDER — PHENTERMINE HCL 37.5 MG PO TABS: ORAL_TABLET | ORAL | 5 refills | Status: DC

## 2022-06-27 NOTE — Progress Notes (Signed)
Subjective:  Patient ID: Ashlee Graves, female    DOB: 1985/05/01  Age: 37 y.o. MRN: [id number]  CC: The primary encounter diagnosis was Essential hypertension. Diagnoses of Overweight (BMI 25.0-29.9), Allergic rhinitis, unspecified seasonality, unspecified trigger, Impaired fasting glucose, and Eating disorder, unspecified type were also pertinent to this visit.   HPI Ashlee Graves presents for  Chief Complaint  Patient presents with   Medical Management of Chronic Issues    6 month follow up    1) Overweight:  patient is disabled due to an acquired cognitive deficit and lacks the capacity for self governance.  As a result she overeats and has only been able to lose weight with pharmacotherapy that suppresses her appetite.  She gained 8 lbs recently.  Her mother has found her sneaking into the kitchen during the day when home, often within 2 hours of eating . She is at day school from 8:30 to 3:00 . The only extra food she gets at school is  from the celebration of  birthdays with birthday cakes. .  Mother has tried to get her to walk but patient is not cooperative due to allergies      Outpatient Medications Prior to Visit  Medication Sig Dispense Refill   citalopram (CELEXA) 10 MG tablet TAKE ONE TABLET BY MOUTH ONE TIME DAILY 90 tablet 1   fexofenadine (ALLEGRA) 180 MG tablet TAKE 1 TABLET (180 MG TOTAL) BY MOUTH 2 (TWO) TIMES DAILY AS NEEDED FOR ALLERGIES OR RHINITIS. 180 tablet 3   hydroquinone 4 % cream Apply topically 2 (two) times daily. To dark patches at face for up to 3 months. 28.35 g 2   hydrOXYzine (ATARAX) 25 MG tablet TAKE 1 TABLET BY MOUTH 3 TIMES DAILY AS NEEDED FOR ITCHING. 270 tablet 1   Multiple Vitamins-Minerals (HAIR VITAMINS PO) Take by mouth daily.      Norgestimate-Ethinyl Estradiol Triphasic (TRI-ESTARYLLA) 0.18/0.215/0.25 MG-35 MCG tablet Take 1 tablet by mouth daily. 84 tablet 3   omeprazole (PRILOSEC) 40 MG capsule TAKE ONE CAPSULE BY MOUTH ONE TIME DAILY 90  capsule 1   telmisartan (MICARDIS) 40 MG tablet TAKE ONE TABLET BY MOUTH AT BEDTIME 90 tablet 1   triamcinolone cream (KENALOG) 0.1 % APPLY TO AFFECTED AREA TWICE A DAY 30 g 0   ketoconazole (NIZORAL) 2 % shampoo Apply 1 application. topically 3 (three) times a week. Wash scalp 3 times weekly, let sit 5 minutes and rinse out (Patient not taking: Reported on 12/24/2021) 120 mL 6   No facility-administered medications prior to visit.    Review of Systems;  Patient denies headache, fevers, malaise, unintentional weight loss, skin rash, eye pain, sinus congestion and sinus pain, sore throat, dysphagia,  hemoptysis , cough, dyspnea, wheezing, chest pain, palpitations, orthopnea, edema, abdominal pain, nausea, melena, diarrhea, constipation, flank pain, dysuria, hematuria, urinary  Frequency, nocturia, numbness, tingling, seizures,  Focal weakness, Loss of consciousness,  Tremor, insomnia, depression, anxiety, and suicidal ideation.      Objective:  BP 104/76   Pulse 65   Temp 98.1 F (36.7 C) (Oral)   Ht 5\' 3"  (1.6 m)   Wt 164 lb 6.4 oz (74.6 kg)   SpO2 98%   BMI 29.12 kg/m   BP Readings from Last 3 Encounters:  06/27/22 104/76  06/06/22 121/72  05/10/22 108/65    Wt Readings from Last 3 Encounters:  06/27/22 164 lb 6.4 oz (74.6 kg)  12/24/21 161 lb 12.8 oz (73.4 kg)  08/20/21 157 lb 3.2 oz (  71.3 kg)    Physical Exam  Lab Results  Component Value Date   HGBA1C 5.3 06/27/2022   HGBA1C 5.2 12/24/2021   HGBA1C 5.3 09/23/2020    Lab Results  Component Value Date   CREATININE 0.69 06/27/2022   CREATININE 0.70 12/24/2021   CREATININE 0.8 04/23/2021    Lab Results  Component Value Date   WBC 7.6 06/27/2022   HGB 12.5 06/27/2022   HCT 38.4 06/27/2022   PLT 320.0 06/27/2022   GLUCOSE 90 06/27/2022   CHOL 170 06/27/2022   TRIG 118.0 06/27/2022   HDL 46.00 06/27/2022   LDLDIRECT 125.0 06/27/2022   LDLCALC 100 (H) 06/27/2022   ALT 20 06/27/2022   AST 68 (H) 06/27/2022    NA 135 06/27/2022   K 4.7 06/27/2022   CL 103 06/27/2022   CREATININE 0.69 06/27/2022   BUN 10 06/27/2022   CO2 27 06/27/2022   TSH 1.50 12/24/2021   HGBA1C 5.3 06/27/2022   MICROALBUR 5 09/23/2020    US Abdomen Limited RUQ  Result Date: 09/13/2019 CLINICAL DATA:  Recurrent vomiting and right upper quadrant pain. EXAM: ULTRASOUND ABDOMEN LIMITED RIGHT UPPER QUADRANT COMPARISON:  None. FINDINGS: Gallbladder: No filled with sludge and shadowing stones. Physiologically distended. There is no wall thickening or pericholecystic fluid. No sonographic Murphy sign noted by sonographer. Common bile duct: Diameter: 6-7 mm. Distal portion is obscured by overlying bowel gas. No choledocholithiasis in the visualized portion. Liver: No focal lesion identified. Heterogeneous and mildly increased in parenchymal echogenicity. Portal vein is patent on color Doppler imaging with normal direction of blood flow towards the liver. Other: No right upper quadrant ascites/free fluid. IMPRESSION: 1. Sludge and stones in the gallbladder. No sonographic findings of acute cholecystitis. 2. Common bile duct at 6-7 mm, borderline dilated. No choledocholithiasis in the visualized portion. Recommend correlation with LFTs. If LFTs are elevated, recommend further evaluation with MRCP. 3. Borderline mild hepatic steatosis. Electronically Signed   By: Narda Rutherford M.D.   On: 09/13/2019 15:56    Assessment & Plan:  .Essential hypertension  Overweight (BMI 25.0-29.9) -     Lipid panel -     LDL cholesterol, direct -     Comprehensive metabolic panel  Allergic rhinitis, unspecified seasonality, unspecified trigger -     CBC with Differential/Platelet  Impaired fasting glucose -     Hemoglobin A1c  Eating disorder, unspecified type Assessment & Plan: Complicated by cognitive impairment since infancy,  Resulting in obesity if left untreated.  The only medication affordable to family is phentermine and she has tolerated  the medication well.in the past.  Mother is aware of the potential risks of long term use     Other orders -     Fluticasone Propionate; Place 2 sprays into both nostrils daily.  Dispense: 16 g; Refill: 6 -     Phentermine HCl; 1/2 tablet twice daily as needed  Dispense: 30 tablet; Refill: 5     I provided 36 minutes of face-to-face time during this encounter reviewing patient's last visit with me,  counseling on weight management in the cognitively impaired ,  and post visit ordering to diagnostics and therapeutics .   Follow-up: Return in about 6 months (around 12/27/2022).   Sherlene Shams, MD

## 2022-06-27 NOTE — Assessment & Plan Note (Addendum)
Complicated by cognitive impairment since infancy,  Resulting in obesity if left untreated.  The only medication affordable to family is phentermine and she has tolerated the medication well.in the past.  Mother is aware of the potential risks of long term use

## 2022-06-27 NOTE — Patient Instructions (Addendum)
I am prescribing  Flonase nasal spray for allergies .  You can stop the fexofenadine after a week if the steroid nasal spray works  better    We have agreed to resume phentermine 1/2 tablet  once or twice daily   I agree with the low carb diet for [employer] Choice  does make low carb entrees:  choose the  "low carb power bowl"  entrees and  "Steamer" entrees   Veggies Made Great ! Makes a low carb spinach & egg white frittata    You might want to try a premixed protein drink called Premier Protein shake either for breakfastor as a snack at night .    160 cal  30 g protein  1 g sugar 50% calcium needs

## 2022-06-28 ENCOUNTER — Encounter: Payer: Self-pay | Admitting: Internal Medicine

## 2022-06-28 DIAGNOSIS — R748 Abnormal levels of other serum enzymes: Secondary | ICD-10-CM | POA: Insufficient documentation

## 2022-06-28 DIAGNOSIS — K7581 Nonalcoholic steatohepatitis (NASH): Secondary | ICD-10-CM | POA: Insufficient documentation

## 2022-06-28 NOTE — Addendum Note (Signed)
Addended by: Sherlene Shams on: 06/28/2022 09:50 PM   Modules accepted: Orders

## 2022-06-28 NOTE — Assessment & Plan Note (Addendum)
Given her history of gallstones..  u/s ordered .  She has a < 1 cm nonbstructing stone,  and changes c/w fatty liver

## 2022-07-15 ENCOUNTER — Other Ambulatory Visit (INDEPENDENT_AMBULATORY_CARE_PROVIDER_SITE_OTHER): Payer: 59

## 2022-07-15 DIAGNOSIS — R748 Abnormal levels of other serum enzymes: Secondary | ICD-10-CM

## 2022-07-15 LAB — BASIC METABOLIC PANEL
BUN: 11 mg/dL (ref 6–23)
CO2: 29 mEq/L (ref 19–32)
Calcium: 8.7 mg/dL (ref 8.4–10.5)
Chloride: 102 mEq/L (ref 96–112)
Creatinine, Ser: 0.66 mg/dL (ref 0.40–1.20)
GFR: 110.55 mL/min (ref >60.00)
Glucose, Bld: 89 mg/dL (ref 70–99)
Potassium: 4.2 mEq/L (ref 3.5–5.1)
Sodium: 137 mEq/L (ref 135–145)

## 2022-07-15 LAB — HEPATIC FUNCTION PANEL
ALT: 10 U/L (ref 0–35)
AST: 16 U/L (ref 0–37)
Albumin: 3.7 g/dL (ref 3.5–5.2)
Alkaline Phosphatase: 42 U/L (ref 39–117)
Bilirubin, Direct: 0 mg/dL (ref 0.0–0.3)
Total Bilirubin: 0.2 mg/dL (ref 0.2–1.2)
Total Protein: 7.2 g/dL (ref 6.0–8.3)

## 2022-07-15 LAB — IBC + FERRITIN
Ferritin: 6.9 ng/mL — ABNORMAL LOW (ref 10.0–291.0)
Iron: 74 ug/dL (ref 42–145)
Saturation Ratios: 20.5 % (ref 20.0–50.0)
TIBC: 361.2 ug/dL (ref 250.0–450.0)
Transferrin: 258 mg/dL (ref 212.0–360.0)

## 2022-07-15 NOTE — Addendum Note (Signed)
Addended by: Warden Fillers on: 07/15/2022 10:16 AM   Modules accepted: Orders

## 2022-07-15 NOTE — Addendum Note (Signed)
Addended by: Remmy Crass S on: 07/15/2022 10:20 AM   Modules accepted: Orders  

## 2022-07-15 NOTE — Progress Notes (Signed)
I have signed all of the pended orders from 06/27/22 OV because pt is here now and had no orders that we could release.

## 2022-07-15 NOTE — Addendum Note (Signed)
Addended by: Warden Fillers on: 07/15/2022 10:23 AM   Modules accepted: Orders

## 2022-07-15 NOTE — Addendum Note (Signed)
Addended by: Warden Fillers on: 07/15/2022 10:14 AM   Modules accepted: Orders

## 2022-07-15 NOTE — Addendum Note (Signed)
Addended by: Warden Fillers on: 07/15/2022 10:20 AM   Modules accepted: Orders

## 2022-07-16 LAB — ALKALINE PHOSPHATASE, ISOENZYMES: Alkaline Phosphatase: 51 IU/L (ref 44–121)

## 2022-07-16 LAB — ANA

## 2022-07-16 LAB — HEPATITIS B CORE ANTIBODY, TOTAL: Hep B Core Total Ab: NEGATIVE

## 2022-07-16 LAB — CERULOPLASMIN: Ceruloplasmin: 55.8 mg/dL — ABNORMAL HIGH (ref 19.0–39.0)

## 2022-07-16 LAB — ANTI-SMOOTH MUSCLE ANTIBODY, IGG: Smooth Muscle Ab: 3 Units (ref 0–19)

## 2022-07-17 ENCOUNTER — Encounter: Payer: Self-pay | Admitting: Internal Medicine

## 2022-07-17 LAB — ALKALINE PHOSPHATASE, ISOENZYMES

## 2022-07-18 LAB — ANTI-SMITH ANTIBODY: ENA SM Ab Ser-aCnc: <0.2 AI (ref 0.0–0.9)

## 2022-07-18 LAB — ANTI-MICROSOMAL ANTIBODY LIVER / KIDNEY: LKM1 Ab: 0.8 Units (ref 0.0–20.0)

## 2022-07-18 NOTE — Telephone Encounter (Signed)
Isn't this done before the appt is scheduled?

## 2022-07-19 NOTE — Telephone Encounter (Signed)
Pt's mother was asking if she needed to contact insurance to see if the [place] was going to be covered. I was asking if approval was done before the [place] was scheduled.

## 2022-07-22 LAB — HEPATITIS B SURFACE ANTIBODY,QUALITATIVE: Hep B Surface Ab, Qual: NONREACTIVE

## 2022-07-22 LAB — ANCA SCREEN W REFLEX TITER: ANCA SCREEN: NEGATIVE

## 2022-07-22 LAB — ALPHA-1-ANTITRYPSIN: A-1 Antitrypsin: 157 mg/dL (ref 100–188)

## 2022-07-22 LAB — HEPATITIS C ANTIBODY: Hep C Virus Ab: NONREACTIVE

## 2022-07-22 LAB — ALKALINE PHOSPHATASE, ISOENZYMES
BONE FRACTION: 43 % (ref 14–68)
LIVER FRACTION: 57 % (ref 18–85)

## 2022-07-22 LAB — MITOCHONDRIAL ANTIBODIES: Mitochondrial Ab: <20 Units (ref 0.0–20.0)

## 2022-08-05 ENCOUNTER — Ambulatory Visit
Admission: RE | Admit: 2022-08-05 | Discharge: 2022-08-05 | Disposition: A | Discharge status: Home / Self Care | Payer: 59 | Source: Ambulatory Visit | Attending: Internal Medicine | Admitting: Internal Medicine

## 2022-08-05 DIAGNOSIS — K8020 Calculus of gallbladder without cholecystitis without obstruction: Secondary | ICD-10-CM | POA: Diagnosis not present

## 2022-08-05 DIAGNOSIS — K760 Fatty (change of) liver, not elsewhere classified: Secondary | ICD-10-CM | POA: Diagnosis not present

## 2022-08-05 DIAGNOSIS — R748 Abnormal levels of other serum enzymes: Secondary | ICD-10-CM | POA: Insufficient documentation

## 2022-08-10 ENCOUNTER — Ambulatory Visit: Payer: 59 | Admitting: *Deleted

## 2022-08-10 ENCOUNTER — Encounter: Payer: Self-pay | Admitting: Internal Medicine

## 2022-08-10 ENCOUNTER — Telehealth (INDEPENDENT_AMBULATORY_CARE_PROVIDER_SITE_OTHER): Payer: 59 | Admitting: Internal Medicine

## 2022-08-10 VITALS — Ht 63.0 in | Wt 164.0 lb

## 2022-08-10 VITALS — Ht 63.0 in | Wt 164.4 lb

## 2022-08-10 DIAGNOSIS — T63421A Toxic effect of venom of ants, accidental (unintentional), initial encounter: Secondary | ICD-10-CM | POA: Diagnosis not present

## 2022-08-10 DIAGNOSIS — Z0000 Encounter for general adult medical examination without abnormal findings: Secondary | ICD-10-CM | POA: Diagnosis not present

## 2022-08-10 MED ORDER — FAMOTIDINE 20 MG PO TABS
20.0000 mg | ORAL_TABLET | Freq: Two times a day (BID) | ORAL | 0 refills | Status: DC
Start: 2022-08-10 — End: 2022-11-14

## 2022-08-10 MED ORDER — TRIAMCINOLONE ACETONIDE 0.5 % EX OINT: 1.0000 | TOPICAL_OINTMENT | Freq: Two times a day (BID) | CUTANEOUS | 0 refills | Status: AC

## 2022-08-10 MED ORDER — PREDNISONE 10 MG PO TABS: ORAL_TABLET | ORAL | 0 refills | Status: DC

## 2022-08-10 NOTE — Patient Instructions (Addendum)
Fire [employer] bites cause an intense allergic reaction and are very painful.  Here's how we are going to treat:  1) Tylenol 1000 mg every 6 hours for 5 days,  then reduce to 1000 mg every 12 hours  2)  Start the prednisone tapering regimen  today (6-5-4-3-2-1-0 )  3) Give [facility] zyrtec chewable (cetirizine)  10 mg every 6 hours  to block histamine and decrease  the itching  4) Add famotidine (prescription) 20 mg every 12 hours to block more histamine   5) add the  the triamcinolone ointment twice daily for the redness and itching   The pustules are STERILE , they are not infected. SO no antibiotic is needed.     HOWEVER, SCRATCHING THEM CAN CAUSE INFECTION BECAUSE OF THE BACTERIA ON YOUR HANDS AND SKIN   Watch for signs of systemic reaction:    swollen lips,  thick tongue., shortness of breath.  TAKE HER TO THE ER IF THIS HAPPENS

## 2022-08-10 NOTE — Progress Notes (Signed)
Virtual Visit via Caregility   Note   This format is felt to be most appropriate for this patient at this time.  All issues noted in this document were discussed and addressed.  No physical exam was performed (except for noted visual exam findings with Video Visits).   I connected with Ashlee Graves  on 08/10/22 at  1:30 PM EDT by a video enabled telemedicine application  and verified that I am speaking with the correct person using two identifiers. Location patient: home Location provider: work or home office Persons participating in the virtual visit: patient, provider  I discussed the limitations, risks, security and privacy concerns of performing an evaluation and management service by telephone and the availability of in person appointments. I also discussed with the patient that there may be a patient responsible charge related to this service. The patient expressed understanding and agreed to proceed.  Reason for visit: cellulitis secondary to fire ant bites   HPI:  37 yr old intellectually challenged female presents with swollen erythematous right ankle with pustular rash following interaction with fire ants yesterday . Patient was on a field trip with her class to a local [place] and stopped on a mound .  She was wearing shoes but no socks and sustained stings to her right ankle and lower leg.  She reports redness, swelling itching and a pustular rash.   Parents gave her an antihistamine yesterday .  She has not had any systemic symptoms specifically thick tongue, swollen lips . Fevers or shortness of breath    ROS: See pertinent positives and negatives per HPI.  Past Medical History:  Diagnosis Date   Allergy    Depression    Developmental delay, moderate     No past surgical history on file.  Family History  Problem Relation Age of Onset   Arthritis/Rheumatoid Mother    Hypertension Father    Hyperlipidemia Father    Cancer Maternal Grandmother    Cancer Maternal Grandfather     Cancer Paternal Grandfather     SOCIAL HX:  reports that she has never smoked. She has never used smokeless tobacco. She reports current alcohol use. She reports that she does not use drugs.    Current Outpatient Medications:    citalopram (CELEXA) 10 MG tablet, TAKE ONE TABLET BY MOUTH ONE TIME DAILY, Disp: 90 tablet, Rfl: 1   famotidine (PEPCID) 20 MG tablet, Take 1 tablet (20 mg total) by mouth 2 (two) times daily., Disp: 30 tablet, Rfl: 0   fexofenadine (ALLEGRA) 180 MG tablet, TAKE 1 TABLET (180 MG TOTAL) BY MOUTH 2 (TWO) TIMES DAILY AS NEEDED FOR ALLERGIES OR RHINITIS., Disp: 180 tablet, Rfl: 3   fluticasone (FLONASE) 50 MCG/ACT nasal spray, Place 2 sprays into both nostrils daily., Disp: 16 g, Rfl: 6   hydroquinone 4 % cream, Apply topically 2 (two) times daily. To dark patches at face for up to 3 months., Disp: 28.35 g, Rfl: 2   hydrOXYzine (ATARAX) 25 MG tablet, TAKE 1 TABLET BY MOUTH 3 TIMES DAILY AS NEEDED FOR ITCHING., Disp: 270 tablet, Rfl: 1   Multiple Vitamins-Minerals (HAIR VITAMINS PO), Take by mouth daily. , Disp: , Rfl:    Norgestimate-Ethinyl Estradiol Triphasic (TRI-ESTARYLLA) 0.18/0.215/0.25 MG-35 MCG tablet, Take 1 tablet by mouth daily., Disp: 84 tablet, Rfl: 3   omeprazole (PRILOSEC) 40 MG capsule, TAKE ONE CAPSULE BY MOUTH ONE TIME DAILY, Disp: 90 capsule, Rfl: 1   phentermine (ADIPEX-P) 37.5 MG tablet, 1/2 tablet twice daily as needed,  Disp: 30 tablet, Rfl: 5   predniSONE (DELTASONE) 10 MG tablet, 6 tablets on Day 1 , then reduce by 1 tablet daily until gone, Disp: 21 tablet, Rfl: 0   telmisartan (MICARDIS) 40 MG tablet, TAKE ONE TABLET BY MOUTH AT BEDTIME, Disp: 90 tablet, Rfl: 1   triamcinolone ointment (KENALOG) 0.5 %, Apply 1 Application topically 2 (two) times daily., Disp: 30 g, Rfl: 0  EXAM:  VITALS per patient if applicable:  GENERAL: alert, oriented, appears well and in no acute distress  HEENT: atraumatic, conjunttiva clear, no obvious abnormalities on  inspection of external nose and ears  NECK: normal movements of the head and neck  LUNGS: on inspection no signs of respiratory distress, breathing rate appears normal, no obvious gross SOB, gasping or wheezing  CV: no obvious cyanosis  MS: moves all visible extremities without noticeable abnormality  PSYCH/NEURO: pleasant and cooperative, no obvious depression or anxiety, speech and thought processing grossly intact  SKIN:  SWELLING AND ERYTHEMA OF DISTAL RIGHT LOWER LEG, ANKLE AND MID FOOT  WITH INTACT PUSTULES SCATTERED ABOUT   ASSESSMENT AND PLAN: Fire ant bite, accidental or unintentional, initial encounter Assessment & Plan: RECOMMENDING USE OF ORAL AND TOPICAL STEROIDS  ORAL ANTIHISTAMINES 9/h2 blockers.,  TYLENOL ,  WATCH FOR SIGNS OF ANAPHYLAXIS.  EXPLAINED THAT THE PUSTUALR RASH IS STERILE and NOT INDICATIVE OF Cellulitis    Other orders -     predniSONE; 6 tablets on Day 1 , then reduce by 1 tablet daily until gone  Dispense: 21 tablet; Refill: 0 -     Triamcinolone Acetonide; Apply 1 Application topically 2 (two) times daily.  Dispense: 30 g; Refill: 0 -     Famotidine; Take 1 tablet (20 mg total) by mouth 2 (two) times daily.  Dispense: 30 tablet; Refill: 0      I discussed the assessment and treatment plan with the patient. The patient was provided an opportunity to ask questions and all were answered. The patient agreed with the plan and demonstrated an understanding of the instructions.   The patient was advised to call back or seek an in-person evaluation if the symptoms worsen or if the condition fails to improve as anticipated.   I spent 30 minutes dedicated to the care of this patient on the date of this encounter to include pre-visit review of hiermedical history,  Face-to-face time with the patient , and post visit ordering of testing and therapeutics.    Sherlene Shams, MD

## 2022-08-10 NOTE — Patient Instructions (Signed)
Per patient no change in vitals since last visit, unable to obtain new vitals due to telehealth visit  Ms. Hyacinth Meeker , Thank you for taking time to come for your Medicare Wellness Visit. I appreciate your ongoing commitment to your health goals. Please review the following plan we discussed and let me know if I can assist you in the future.   These are the goals we discussed:  Goals      Patient Stated     Wants to walk more and lose weight        This is a list of the screening recommended for you and due dates:  Health Maintenance  Topic Date Due   HIV Screening  Never done   Pap Smear  10/06/2020   COVID-19 Vaccine (4 - 2023-24 season) 09/24/2021   Flu Shot  08/25/2022   Medicare Annual Wellness Visit  08/10/2023   DTaP/Tdap/Td vaccine (3 - Tdap) 04/15/2025   Hepatitis C Screening  Completed   HPV Vaccine  Aged Out    Advanced directives: Discussed with patient's mom  Conditions/risks identified: none  Next appointment: Follow up in one year for your annual wellness visit. 09/01/23 @ 10:45  Preventive Care 64-95 Years Old, Female Preventive care refers to lifestyle choices and visits with your health care provider that can promote health and wellness. Preventive care visits are also called wellness exams. What can I expect for my preventive care visit? Counseling During your preventive care visit, your health care provider may ask about your: Medical history, including: Past medical problems. Family medical history. Pregnancy history. Current health, including: Menstrual cycle. Method of birth control. Emotional well-being. Home life and relationship well-being. Sexual activity and sexual health. Lifestyle, including: Alcohol, nicotine or tobacco, and drug use. Access to firearms. Diet, exercise, and sleep habits. Work and work Astronomer. Sunscreen use. Safety issues such as seatbelt and bike helmet use. Physical exam Your health care provider may check  your: Height and weight. These may be used to calculate your BMI (body mass index). BMI is a measurement that tells if you are at a healthy weight. Waist circumference. This measures the distance around your waistline. This measurement also tells if you are at a healthy weight and may help predict your risk of certain diseases, such as type 2 diabetes and high blood pressure. Heart rate and blood pressure. Body temperature. Skin for abnormal spots. What immunizations do I need? Vaccines are usually given at various ages, according to a schedule. Your health care provider will recommend vaccines for you based on your age, medical history, and lifestyle or other factors, such as travel or where you work. What tests do I need? Screening Your health care provider may recommend screening tests for certain conditions. This may include: Pelvic exam and Pap test. Lipid and cholesterol levels. Diabetes screening. This is done by checking your blood sugar (glucose) after you have not eaten for a while (fasting). Hepatitis B test. Hepatitis C test. HIV (human immunodeficiency virus) test. STI (sexually transmitted infection) testing, if you are at risk. BRCA-related cancer screening. This may be done if you have a family history of breast, ovarian, tubal, or peritoneal cancers. Talk with your health care provider about your test results, treatment options, and if necessary, the need for more tests. Follow these instructions at home: Eating and drinking  Eat a healthy diet that includes fresh fruits and vegetables, whole grains, lean protein, and low-fat dairy products. Take vitamin and mineral supplements as recommended by your  health care provider. Do not drink alcohol if: Your health care provider tells you not to drink. You are pregnant, may be pregnant, or are planning to become pregnant. If you drink alcohol: Limit how much you have to 0-1 drink a day. Know how much alcohol is in your drink.  In the [place] one drink equals one 12 oz bottle of beer (355 mL), one 5 oz glass of wine (148 mL), or one 1 oz glass of hard liquor (44 mL). Lifestyle Brush your teeth every morning and night with fluoride toothpaste. Floss one time each day. Exercise for at least 30 minutes 5 or more days each week. Do not use any products that contain nicotine or tobacco. These products include cigarettes, chewing tobacco, and vaping devices, such as e-cigarettes. If you need help quitting, ask your health care provider. Do not use drugs. If you are sexually active, practice safe sex. Use a condom or other form of protection to prevent STIs. If you do not wish to become pregnant, use a form of birth control. If you plan to become pregnant, see your health care provider for a prepregnancy visit. Find healthy ways to manage stress, such as: Meditation, yoga, or listening to music. Journaling. Talking to a trusted person. Spending time with friends and family. Minimize exposure to UV radiation to reduce your risk of skin cancer. Safety Always wear your seat belt while driving or riding in a vehicle. Do not drive: If you have been drinking alcohol. Do not ride with someone who has been drinking. If you have been using any mind-altering substances or drugs. While texting. When you are tired or distracted. Wear a helmet and other protective equipment during sports activities. If you have firearms in your house, make sure you follow all gun safety procedures. Seek help if you have been physically or sexually abused. What's next? Go to your health care provider once a year for an annual wellness visit. Ask your health care provider how often you should have your eyes and teeth checked. Stay up to date on all vaccines. This information is not intended to replace advice given to you by your health care provider. Make sure you discuss any questions you have with your health care provider. Document Revised:  07/08/2020 Document Reviewed: 07/08/2020 Elsevier Patient Education  2022 [employer].

## 2022-08-10 NOTE — Progress Notes (Addendum)
Subjective:   Ashlee Graves is a 36 y.o. female who presents for an Initial Medicare Annual Wellness Visit.  Visit Complete: Virtual  I connected with  Kirt Boys on 08/10/22 by a audio enabled telemedicine application and verified that I am speaking with the correct person using two identifiers.  Patient Location: Home  Provider Location: Office/Clinic  I discussed the limitations of evaluation and management by telemedicine. The patient expressed understanding and agreed to proceed.   Review of Systems     Cardiac Risk Factors include: hypertension     Objective:    Today's Vitals   08/10/22 1512 08/10/22 1513  Weight: 164 lb (74.4 kg)   Height: 5\' 3"  (1.6 m)   PainSc:  9    Body mass index is 29.05 kg/m.     08/10/2022    3:41 PM 08/14/2015    3:40 PM  Advanced Directives  Does Patient Have a Medical Advance Directive? No Yes  Type of Advance Directive  Healthcare Power of Attorney    Current Medications (verified) Outpatient Encounter Medications as of 08/10/2022  Medication Sig   Biotin 1000 MCG tablet Take 1,000 mcg by mouth 3 (three) times daily. Take one daily   citalopram (CELEXA) 10 MG tablet TAKE ONE TABLET BY MOUTH ONE TIME DAILY   famotidine (PEPCID) 20 MG tablet Take 1 tablet (20 mg total) by mouth 2 (two) times daily.   fexofenadine (ALLEGRA) 180 MG tablet TAKE 1 TABLET (180 MG TOTAL) BY MOUTH 2 (TWO) TIMES DAILY AS NEEDED FOR ALLERGIES OR RHINITIS.   fluticasone (FLONASE) 50 MCG/ACT nasal spray Place 2 sprays into both nostrils daily.   hydroquinone 4 % cream Apply topically 2 (two) times daily. To dark patches at face for up to 3 months.   hydrOXYzine (ATARAX) 25 MG tablet TAKE 1 TABLET BY MOUTH 3 TIMES DAILY AS NEEDED FOR ITCHING.   Multiple Vitamins-Minerals (HAIR SKIN AND NAILS FORMULA PO) Take by mouth. Two two daily   Multiple Vitamins-Minerals (HAIR VITAMINS PO) Take by mouth daily.    Norgestimate-Ethinyl Estradiol Triphasic  (TRI-ESTARYLLA) 0.18/0.215/0.25 MG-35 MCG tablet Take 1 tablet by mouth daily.   omeprazole (PRILOSEC) 40 MG capsule TAKE ONE CAPSULE BY MOUTH ONE TIME DAILY   phentermine (ADIPEX-P) 37.5 MG tablet 1/2 tablet twice daily as needed   predniSONE (DELTASONE) 10 MG tablet 6 tablets on Day 1 , then reduce by 1 tablet daily until gone   telmisartan (MICARDIS) 40 MG tablet TAKE ONE TABLET BY MOUTH AT BEDTIME   triamcinolone ointment (KENALOG) 0.5 % Apply 1 Application topically 2 (two) times daily.   No facility-administered encounter medications on file as of 08/10/2022.    Allergies (verified) Patient has no known allergies.   History: Past Medical History:  Diagnosis Date   Allergy    Depression    Developmental delay, moderate    History reviewed. No pertinent surgical history. Family History  Problem Relation Age of Onset   Arthritis/Rheumatoid Mother    Hypertension Father    Hyperlipidemia Father    Cancer Maternal Grandmother    Cancer Maternal Grandfather    Cancer Paternal Grandfather    Social History   Socioeconomic History   Marital status: Single    Spouse name: Not on file   Number of children: Not on file   Years of education: Not on file   Highest education level: Not on file  Occupational History   Not on file  Tobacco Use   Smoking status: Never  Smokeless tobacco: Never  Vaping Use   Vaping status: Never Used  Substance and Sexual Activity   Alcohol use: Yes    Comment: occasionally   Drug use: No   Sexual activity: Not on file  Other Topics Concern   Not on file  Social History Narrative   Not on file   Social Determinants of Health   Financial Resource Strain: Low Risk  (08/10/2022)   Overall Financial Resource Strain (CARDIA)    Difficulty of Paying Living Expenses: Not hard at all  Food Insecurity: No Food Insecurity (08/10/2022)   Hunger Vital Sign    Worried About Running Out of Food in the Last Year: Never true    Ran Out of Food in the  Last Year: Never true  Transportation Needs: No Transportation Needs (08/10/2022)   PRAPARE - Administrator, Civil Service (Medical): No    Lack of Transportation (Non-Medical): No  Physical Activity: Insufficiently Active (08/10/2022)   Exercise Vital Sign    Days of Exercise per Week: 1 day    Minutes of Exercise per Session: 60 min  Stress: No Stress Concern Present (08/10/2022)   [employer] of Occupational Health - Occupational Stress Questionnaire    Feeling of Stress : Only a little  Social Connections: Moderately Isolated (08/10/2022)   Social Connection and Isolation Panel [NHANES]    Frequency of Communication with Friends and Family: More than three times a week    Frequency of Social Gatherings with Friends and Family: More than three times a week    Attends Religious Services: More than 4 times per year    Active Member of [employer] or Organizations: No    Attends Engineer, structural: Never    Marital Status: Never married    Tobacco Counseling Counseling given: Not Answered   Clinical Intake:  Pre-visit preparation completed: Yes  Pain : 0-10 Pain Score: 9  Pain Type: Acute pain Pain Location: Foot Pain Orientation: Right Pain Descriptors / Indicators: Burning Pain Onset: Today Pain Frequency: Intermittent     BMI - recorded: 29.05 Nutritional Risks: None Diabetes: No  How often do you need to have someone help you when you read instructions, pamphlets, or other written materials from your doctor or pharmacy?: 1 - Never  Interpreter Needed?: No  Information entered by :: R. Leasa Kincannon LPN   Activities of Daily Living    08/10/2022    3:16 PM  In your present state of health, do you have any difficulty performing the following activities:  Hearing? 0  Vision? 0  Comment readers  Difficulty concentrating or making decisions? 1  Comment at times  Walking or climbing stairs? 0  Dressing or bathing? 1  Comment needs help with hair   Doing errands, shopping? 1  Comment needs some help  Preparing Food and eating ? Y  Comment has limits  Using the Toilet? N  In the past six months, have you accidently leaked urine? Y  Comment at times  Do you have problems with loss of bowel control? N  Managing your Medications? Y  Comment mom takes care of this  Managing your Finances? Y  Housekeeping or managing your Housekeeping? Y    Patient Care Team: Sherlene Shams, MD as PCP - General (Internal Medicine) Debbe Odea, MD as PCP - Cardiology (Cardiology)  Indicate any recent Medical Services you may have received from other than [facility] providers in the past year (date may be approximate).  Assessment:   This is a routine wellness examination for [place].  Hearing/Vision screen Hearing Screening - Comments:: No issues Vision Screening - Comments:: glasses  Dietary issues and exercise activities discussed:     Goals Addressed             This Visit's Progress    Patient Stated       Wants to walk more and lose weight       Depression Screen    08/10/2022    3:27 PM 06/27/2022    9:14 AM 12/24/2021    3:00 PM 08/20/2021    3:41 PM 04/09/2021    3:32 PM 12/04/2020    3:06 PM 08/28/2020    3:30 PM  PHQ 2/9 Scores  PHQ - 2 Score 0 0 0 0 0 0 0  PHQ- 9 Score 4   2 0      Fall Risk    08/10/2022    3:36 PM 06/27/2022    8:54 AM 12/24/2021    3:00 PM 04/09/2021    3:32 PM 12/04/2020    3:06 PM  Fall Risk   Falls in the past year? 1 1 0 0 0  Number falls in past yr: 1 0 0    Injury with Fall? 1  0    Comment burised      Risk for fall due to : History of fall(s) History of fall(s) No Fall Risks No Fall Risks No Fall Risks  Follow up Falls evaluation completed;Education provided;Falls prevention discussed Falls evaluation completed Falls evaluation completed Falls evaluation completed Falls evaluation completed    MEDICARE RISK AT HOME:  Medicare Risk at Home - 08/10/22 1537     Any stairs in or  around the home? Yes    If so, are there any without handrails? Yes    Home free of loose throw rugs in walkways, pet beds, electrical cords, etc? Yes    Adequate lighting in your home to reduce risk of falls? Yes    Life alert? No    Use of a cane, walker or w/c? No    Grab bars in the bathroom? No    Shower chair or bench in shower? No    Elevated toilet seat or a handicapped toilet? Yes              Cognitive Function:        08/10/2022    3:42 PM  6CIT Screen  What Year? 0 points  What month? 0 points  What time? 0 points  Count back from 20 0 points  Months in reverse 4 points  Repeat phrase 8 points  Total Score 12 points    Immunizations Immunization History  Administered Date(s) Administered   DTaP 09/28/2006   Influenza Whole 11/24/2009   Influenza,inj,Quad PF,6+ Mos 12/24/2021   Influenza-Unspecified 10/24/2013, 12/03/2014, 10/21/2015   Moderna Sars-Covid-2 Vaccination 03/06/2020   PFIZER(Purple Top)SARS-COV-2 Vaccination 05/11/2019, 06/01/2019   Td 04/16/2015    TDAP status: Up to date  Flu Vaccine status: Up to date    Covid-19 vaccine status: Completed vaccines  Qualifies for Shingles Vaccine? No     Screening Tests Health Maintenance  Topic Date Due   HIV Screening  Never done   Medicare Annual Wellness (AWV)  08/13/2016   PAP SMEAR-Modifier  10/06/2020   COVID-19 Vaccine (4 - 2023-24 season) 09/24/2021   INFLUENZA VACCINE  08/25/2022   DTaP/Tdap/Td (3 - Tdap) 04/15/2025   Hepatitis C Screening  Completed  HPV VACCINES  Aged Out    Health Maintenance  Health Maintenance Due  Topic Date Due   HIV Screening  Never done   Medicare Annual Wellness (AWV)  08/13/2016   PAP SMEAR-Modifier  10/06/2020   COVID-19 Vaccine (4 - 2023-24 season) 09/24/2021        Lung Cancer Screening: (Low Dose CT Chest recommended if Age 32-80 years, 20 pack-year currently smoking OR have quit w/in 15years.) does not qualify.     Additional  Screening:  Hepatitis C Screening: does qualify; Completed 6/24  Vision Screening: Recommended annual ophthalmology exams for early detection of glaucoma and other disorders of the eye. Is the patient up to date with their annual eye exam?  Yes  Who is the provider or what is the name of the office in which the patient attends annual eye exams? [facility] If pt is not established with a provider, would they like to be referred to a provider to establish care? No .   Dental Screening: Recommended annual dental exams for proper oral hygiene    Community Resource Referral / Chronic Care Management: CRR required this visit?  No   CCM required this visit?  No     Plan:     I have personally reviewed and noted the following in the patient's chart:   Medical and social history Use of alcohol, tobacco or illicit drugs  Current medications and supplements including opioid prescriptions. Patient is not currently taking opioid prescriptions. Functional ability and status Nutritional status Physical activity Advanced directives List of other physicians Hospitalizations, surgeries, and ER visits in previous 12 months Vitals Screenings to include cognitive, depression, and falls Referrals and appointments  In addition, I have reviewed and discussed with patient certain preventive protocols, quality metrics, and best practice recommendations. A written personalized care plan for preventive services as well as general preventive health recommendations were provided to patient.     Sydell Axon, LPN   [id number]/84/6962   After Visit Summary: (MyChart) Due to this being a telephonic visit, the after visit summary with patients personalized plan was offered to patient via MyChart   Nurse Notes: None     I have reviewed the above information and agree with above.   Duncan Dull, MD

## 2022-08-10 NOTE — Assessment & Plan Note (Signed)
RECOMMENDING USE OF ORAL AND TOPICAL STEROIDS  ORAL ANTIHISTAMINES 9/h2 blockers.,  TYLENOL ,  WATCH FOR SIGNS OF ANAPHYLAXIS.  EXPLAINED THAT THE PUSTUALR RASH IS STERILE and NOT INDICATIVE OF Cellulitis

## 2022-08-19 ENCOUNTER — Other Ambulatory Visit: Payer: Self-pay | Admitting: Internal Medicine

## 2022-08-19 ENCOUNTER — Other Ambulatory Visit: Payer: Self-pay

## 2022-08-19 MED ORDER — NORGESTIM-ETH ESTRAD TRIPHASIC 0.18/0.215/0.25 MG-35 MCG PO TABS: 1.0000 | ORAL_TABLET | Freq: Every day | ORAL | 3 refills | Status: DC

## 2022-10-03 ENCOUNTER — Other Ambulatory Visit: Payer: Self-pay | Admitting: Internal Medicine

## 2022-11-14 ENCOUNTER — Ambulatory Visit: Payer: 59 | Admitting: Internal Medicine

## 2022-11-14 ENCOUNTER — Encounter: Payer: Self-pay | Admitting: Internal Medicine

## 2022-11-14 VITALS — BP 114/80 | HR 73 | Ht 63.0 in | Wt 153.8 lb

## 2022-11-14 DIAGNOSIS — K7581 Nonalcoholic steatohepatitis (NASH): Secondary | ICD-10-CM

## 2022-11-14 DIAGNOSIS — R748 Abnormal levels of other serum enzymes: Secondary | ICD-10-CM | POA: Diagnosis not present

## 2022-11-14 DIAGNOSIS — Z79899 Other long term (current) drug therapy: Secondary | ICD-10-CM

## 2022-11-14 DIAGNOSIS — F411 Generalized anxiety disorder: Secondary | ICD-10-CM

## 2022-11-14 DIAGNOSIS — Z23 Encounter for immunization: Secondary | ICD-10-CM

## 2022-11-14 LAB — HEPATIC FUNCTION PANEL
ALT: 9 U/L (ref 0–35)
AST: 19 U/L (ref 0–37)
Albumin: 3.8 g/dL (ref 3.5–5.2)
Alkaline Phosphatase: 45 U/L (ref 39–117)
Bilirubin, Direct: 0 mg/dL (ref 0.0–0.3)
Total Bilirubin: 0.1 mg/dL — ABNORMAL LOW (ref 0.2–1.2)
Total Protein: 7.2 g/dL (ref 6.0–8.3)

## 2022-11-14 MED ORDER — PHENTERMINE HCL 37.5 MG PO TABS: ORAL_TABLET | ORAL | 5 refills | Status: DC

## 2022-11-14 MED ORDER — CITALOPRAM HYDROBROMIDE 10 MG PO TABS: 10.0000 mg | ORAL_TABLET | Freq: Every day | ORAL | 1 refills | Status: DC

## 2022-11-14 MED ORDER — FAMOTIDINE 20 MG PO TABS: 20.0000 mg | ORAL_TABLET | Freq: Two times a day (BID) | ORAL | 1 refills | Status: DC

## 2022-11-14 NOTE — Assessment & Plan Note (Signed)
Managed with low dose celexa .and prn hydroxyzine   No changes today,  some anxiety on MOndays prior to going to school.

## 2022-11-14 NOTE — Progress Notes (Signed)
Subjective:  Patient ID: Ashlee Graves, female    DOB: 1985-08-29  Age: 37 y.o. MRN: [id number]  CC: The primary encounter diagnosis was Elevated liver enzymes. Diagnoses of Long-term current use of high risk medication other than anticoagulant, Need for influenza vaccination, Generalized anxiety disorder, Metabolic dysfunction-associated steatohepatitis (MASH), and Mild perinatal anoxic-ischemic brain injury were also pertinent to this visit.   HPI Ashlee Graves presents for  Chief Complaint  Patient presents with   Medical Management of Chronic Issues    1) overweight:  she has lost 11 lbs since July  using 1/2 tablet phentermine twice daily for appetite suppression.  Her mother has been modifying her diet to include low carb breads,  low carb fruits.  She is walking for exercise about  days per week .   Mother has been checking her vital signs which has been normal.  l  Outpatient Medications Prior to Visit  Medication Sig Dispense Refill   Biotin 1000 MCG tablet Take 1,000 mcg by mouth 3 (three) times daily. Take one daily     fluticasone (FLONASE) 50 MCG/ACT nasal spray Place 2 sprays into both nostrils daily. 16 g 6   hydroquinone 4 % cream Apply topically 2 (two) times daily. To dark patches at face for up to 3 months. 28.35 g 2   hydrOXYzine (ATARAX) 25 MG tablet TAKE 1 TABLET BY MOUTH 3 TIMES DAILY AS NEEDED FOR ITCHING. 270 tablet 1   Multiple Vitamins-Minerals (HAIR SKIN AND NAILS FORMULA PO) Take by mouth. Two two daily     Multiple Vitamins-Minerals (HAIR VITAMINS PO) Take by mouth daily.      Norgestimate-Ethinyl Estradiol Triphasic (TRI-ESTARYLLA) 0.18/0.215/0.25 MG-35 MCG tablet Take 1 tablet by mouth daily. 84 tablet 3   telmisartan (MICARDIS) 40 MG tablet TAKE ONE TABLET BY MOUTH AT BEDTIME 90 tablet 1   triamcinolone ointment (KENALOG) 0.5 % Apply 1 Application topically 2 (two) times daily. 30 g 0   citalopram (CELEXA) 10 MG tablet TAKE ONE TABLET BY MOUTH ONE TIME  DAILY 90 tablet 1   famotidine (PEPCID) 20 MG tablet Take 1 tablet (20 mg total) by mouth 2 (two) times daily. 30 tablet 0   omeprazole (PRILOSEC) 40 MG capsule TAKE ONE CAPSULE BY MOUTH ONE TIME DAILY 90 capsule 1   phentermine (ADIPEX-P) 37.5 MG tablet 1/2 tablet twice daily as needed 30 tablet 5   fexofenadine (ALLEGRA) 180 MG tablet TAKE 1 TABLET (180 MG TOTAL) BY MOUTH 2 (TWO) TIMES DAILY AS NEEDED FOR ALLERGIES OR RHINITIS. (Patient not taking: Reported on 11/14/2022) 180 tablet 3   predniSONE (DELTASONE) 10 MG tablet 6 tablets on Day 1 , then reduce by 1 tablet daily until gone (Patient not taking: Reported on 11/14/2022) 21 tablet 0   No facility-administered medications prior to visit.    Review of Systems;  Patient denies headache, fevers, malaise, unintentional weight loss, skin rash, eye pain, sinus congestion and sinus pain, sore throat, dysphagia,  hemoptysis , cough, dyspnea, wheezing, chest pain, palpitations, orthopnea, edema, abdominal pain, nausea, melena, diarrhea, constipation, flank pain, dysuria, hematuria, urinary  Frequency, nocturia, numbness, tingling, seizures,  Focal weakness, Loss of consciousness,  Tremor, insomnia, depression, anxiety, and suicidal ideation.      Objective:  BP 114/80   Pulse 73   Ht 5\' 3"  (1.6 m)   Wt 153 lb 12.8 oz (69.8 kg)   SpO2 98%   BMI 27.24 kg/m   BP Readings from Last 3 Encounters:  11/14/22 114/80  06/27/22 104/76  06/06/22 121/72    Wt Readings from Last 3 Encounters:  11/14/22 153 lb 12.8 oz (69.8 kg)  08/10/22 164 lb (74.4 kg)  08/10/22 164 lb 6.4 oz (74.6 kg)    Physical Exam Vitals reviewed.  Constitutional:      General: She is not in acute distress.    Appearance: Normal appearance. She is normal weight. She is not ill-appearing, toxic-appearing or diaphoretic.  HENT:     Head: Normocephalic.  Eyes:     General: No scleral icterus.       Right eye: No discharge.        Left eye: No discharge.      Conjunctiva/sclera: Conjunctivae normal.  Cardiovascular:     Rate and Rhythm: Normal rate and regular rhythm.     Heart sounds: Normal heart sounds.  Pulmonary:     Effort: Pulmonary effort is normal. No respiratory distress.     Breath sounds: Normal breath sounds.  Musculoskeletal:        General: Normal range of motion.  Skin:    General: Skin is warm and dry.  Neurological:     General: No focal deficit present.     Mental Status: She is alert and oriented to person, place, and time. Mental status is at baseline.  Psychiatric:        Mood and Affect: Mood normal.        Behavior: Behavior normal.        Thought Content: Thought content normal.        Judgment: Judgment normal.    Lab Results  Component Value Date   HGBA1C 5.3 06/27/2022   HGBA1C 5.2 12/24/2021   HGBA1C 5.3 09/23/2020    Lab Results  Component Value Date   CREATININE 0.66 07/15/2022   CREATININE 0.69 06/27/2022   CREATININE 0.70 12/24/2021    Lab Results  Component Value Date   WBC 7.6 06/27/2022   HGB 12.5 06/27/2022   HCT 38.4 06/27/2022   PLT 320.0 06/27/2022   GLUCOSE 89 07/15/2022   CHOL 170 06/27/2022   TRIG 118.0 06/27/2022   HDL 46.00 06/27/2022   LDLDIRECT 125.0 06/27/2022   LDLCALC 100 (H) 06/27/2022   ALT 9 11/14/2022   AST 19 11/14/2022   NA 137 07/15/2022   K 4.2 07/15/2022   CL 102 07/15/2022   CREATININE 0.66 07/15/2022   BUN 11 07/15/2022   CO2 29 07/15/2022   TSH 1.50 12/24/2021   HGBA1C 5.3 06/27/2022   MICROALBUR 5 09/23/2020    US Abdomen Limited RUQ (LIVER/GB)  Result Date: 08/05/2022 CLINICAL DATA:  Elevated liver enzymes. EXAM: ULTRASOUND ABDOMEN LIMITED RIGHT UPPER QUADRANT COMPARISON:  September 13, 2019 FINDINGS: Gallbladder: Gallstones are noted, largest measures 9.6 mm. No wall thickening visualized. No sonographic Murphy sign noted by sonographer. Common bile duct: Diameter: 6.3 mm Liver: No focal lesion identified. Increased echotexture. Portal vein is  patent on color Doppler imaging with normal direction of blood flow towards the liver. Other: None. IMPRESSION: 1. Cholelithiasis without sonographic evidence of acute cholecystitis. 2. Fatty infiltration of the liver. Electronically Signed   By: Sherian Rein M.D.   On: 08/05/2022 09:59    Assessment & Plan:  .Elevated liver enzymes Assessment & Plan: FATTY LIVER NOTED ON ULTRASOUND . Continued weight loss advised,  ideally needs GP 1 agonist but her insurance will not pay   Orders: -     Hepatic function panel  Long-term current use of high risk medication other  than anticoagulant Assessment & Plan: She has been unable to maintain a healthy weight without use of phentermine and has developed fatty liver.  Continue use of phentermine  for now . Discussed need to use GLP 1 agonist  when affordable.    Need for influenza vaccination -     Flu vaccine trivalent PF, 6mos and older(Flulaval,Afluria,Fluarix,Fluzone)  Generalized anxiety disorder Assessment & Plan: Managed with low dose celexa .and prn hydroxyzine   No changes today,  some anxiety on MOndays prior to going to school.   Metabolic dysfunction-associated steatohepatitis (MASH) Assessment & Plan: FATTY LIVER NOTED ON ULTRASOUND . Continued weight loss advised,  ideally needs GP 1 agonist but her insurance will not pay    Mild perinatal anoxic-ischemic brain injury Assessment & Plan: Reportedly occurring at birth.  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    Other orders -     Citalopram Hydrobromide; Take 1 tablet (10 mg total) by mouth daily.  Dispense: 90 tablet; Refill: 1 -     Famotidine; Take 1 tablet (20 mg total) by mouth 2 (two) times daily.  Dispense: 180 tablet; Refill: 1 -     Phentermine HCl; 1/2 tablet twice daily as needed  Dispense: 30 tablet; Refill:  5     I provided 30 minutes of face-to-face time during this encounter reviewing patient's last visit with me, previous  labs and imaging studies, counseling on currently addressed issues,  and post visit ordering to diagnostics and therapeutics .   Follow-up: Return in about 6 months (around 05/15/2023).   Sherlene Shams, MD

## 2022-11-14 NOTE — Patient Instructions (Signed)
Ashlee Graves is doing well on 1/2 tablet of phentermine twice daily  Trial of famotidine twice daily instead of omeprazole for treatment of GERD, as this is safer LONG TERM IF IT IS EFFECTIVE IN CONTROLLING REFLUX

## 2022-11-14 NOTE — Assessment & Plan Note (Signed)
FATTY LIVER NOTED ON ULTRASOUND . Continued weight loss advised,  ideally needs GP 1 agonist but her insurance will not pay

## 2022-11-14 NOTE — Assessment & Plan Note (Signed)
She has been unable to maintain a healthy weight without use of phentermine and has developed fatty liver.  Continue use of phentermine  for now . Discussed need to use GLP 1 agonist  when affordable.

## 2022-11-14 NOTE — Assessment & Plan Note (Signed)
Reportedly occurring at birth.  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

## 2022-11-15 ENCOUNTER — Other Ambulatory Visit: Payer: Self-pay | Admitting: Internal Medicine

## 2022-12-30 ENCOUNTER — Ambulatory Visit: Payer: 59 | Admitting: Internal Medicine

## 2023-01-02 ENCOUNTER — Ambulatory Visit: Payer: 59 | Admitting: Internal Medicine

## 2023-01-05 ENCOUNTER — Encounter: Payer: Self-pay | Admitting: Internal Medicine

## 2023-01-05 NOTE — Telephone Encounter (Signed)
Spoke with pt's mother to let her know that it was refilled on 11/14/2022 with 5 refills. Mother stated that the bottle she has states no refills remaining. I called the pharmacy and they do have it on file and is going to process the refill. Called pt's mother back to let her know.

## 2023-04-02 ENCOUNTER — Other Ambulatory Visit: Payer: Self-pay | Admitting: Internal Medicine

## 2023-05-14 ENCOUNTER — Other Ambulatory Visit: Payer: Self-pay | Admitting: Internal Medicine

## 2023-05-16 ENCOUNTER — Ambulatory Visit (INDEPENDENT_AMBULATORY_CARE_PROVIDER_SITE_OTHER): Payer: 59 | Admitting: Dermatology

## 2023-05-16 ENCOUNTER — Other Ambulatory Visit: Payer: Self-pay | Admitting: Internal Medicine

## 2023-05-16 ENCOUNTER — Encounter: Payer: Self-pay | Admitting: Dermatology

## 2023-05-16 DIAGNOSIS — Z86018 Personal history of other benign neoplasm: Secondary | ICD-10-CM | POA: Diagnosis not present

## 2023-05-16 DIAGNOSIS — L7211 Pilar cyst: Secondary | ICD-10-CM

## 2023-05-16 DIAGNOSIS — D2262 Melanocytic nevi of left upper limb, including shoulder: Secondary | ICD-10-CM

## 2023-05-16 DIAGNOSIS — D369 Benign neoplasm, unspecified site: Secondary | ICD-10-CM

## 2023-05-16 DIAGNOSIS — L821 Other seborrheic keratosis: Secondary | ICD-10-CM | POA: Diagnosis not present

## 2023-05-16 DIAGNOSIS — D225 Melanocytic nevi of trunk: Secondary | ICD-10-CM | POA: Diagnosis not present

## 2023-05-16 DIAGNOSIS — D2271 Melanocytic nevi of right lower limb, including hip: Secondary | ICD-10-CM

## 2023-05-16 DIAGNOSIS — D2339 Other benign neoplasm of skin of other parts of face: Secondary | ICD-10-CM

## 2023-05-16 DIAGNOSIS — Z1283 Encounter for screening for malignant neoplasm of skin: Secondary | ICD-10-CM

## 2023-05-16 DIAGNOSIS — D1801 Hemangioma of skin and subcutaneous tissue: Secondary | ICD-10-CM

## 2023-05-16 DIAGNOSIS — L814 Other melanin hyperpigmentation: Secondary | ICD-10-CM

## 2023-05-16 DIAGNOSIS — D229 Melanocytic nevi, unspecified: Secondary | ICD-10-CM

## 2023-05-16 DIAGNOSIS — D2272 Melanocytic nevi of left lower limb, including hip: Secondary | ICD-10-CM | POA: Diagnosis not present

## 2023-05-16 DIAGNOSIS — D2261 Melanocytic nevi of right upper limb, including shoulder: Secondary | ICD-10-CM

## 2023-05-16 NOTE — Progress Notes (Signed)
   Follow-Up Visit   Subjective  Ashlee Graves is a 38 y.o. female who presents for the following: Skin Cancer Screening and Full Body Skin Exam. No personal hx of skin cancer. Hx of dysplastic nevus.   The patient presents for Total-Body Skin Exam (TBSE) for skin cancer screening and mole check. The patient has spots, moles and lesions to be evaluated, some may be new or changing and the patient may have concern these could be cancer.  Mother, Jenette Mitchell, is with patient and contributes to history.   The following portions of the chart were reviewed this encounter and updated as appropriate: medications, allergies, medical history  Review of Systems:  No other skin or systemic complaints except as noted in HPI or Assessment and Plan.  Objective  Well appearing patient in no apparent distress; mood and affect are within normal limits.  A full examination was performed including scalp, head, eyes, ears, nose, lips, neck, chest, axillae, abdomen, back, buttocks, bilateral upper extremities, bilateral lower extremities, hands, feet, fingers, toes, fingernails, and toenails. All findings within normal limits unless otherwise noted below.   Relevant physical exam findings are noted in the Assessment and Plan.    Assessment & Plan   SKIN CANCER SCREENING PERFORMED TODAY.  LENTIGINES (face), SEBORRHEIC KERATOSES, HEMANGIOMAS - Benign normal skin lesions - SK upper abdomen- waxy tan papule - Benign-appearing - Call for any changes  MELANOCYTIC NEVI. Back, arms, legs. - Tan-brown and/or pink-flesh-colored symmetric macules and papules  L lower pretibial 7.0 x 6.64mm med light brown papule within scar   Right Lower Back 2 mm Brown macules x2 within 1.0 cm scar, c/w recurrent nevus, see 3/22 photo   L spinal mid back 5.0 x 3.76mm med brown macule with darker center   L spinal lower back 4.64mm med brown macule with notch   Right Abdomen  4.0 x 3.41mm med brown speckled macule with  notch  - Benign appearing on exam today,  Stable compared to previous visit.  - Observation - Call clinic for new or changing moles - Recommend daily use of broad spectrum spf 30+ sunscreen to sun-exposed areas.    Angiofibroma/Fibrous Papule. Left nasal ala. - 2 mm firm pink papule without features suspicious for malignancy on dermoscopy - Benign-appearing.  Observation.  Discussed shave removal if irritated.  Call clinic for new or changing lesions.    Pilar Cyst Exam: Subcutaneous nodule at vertex scalp.  Benign-appearing. Exam most consistent with a pilar cyst. Discussed that a cyst is a benign growth that can grow over time and sometimes get irritated or inflamed. Recommend observation if it is not bothersome. Discussed option of surgical excision to remove it if it is growing, symptomatic, or other changes noted. Please call for new or changing lesions so they can be evaluated. May call back to schedule excision/surgery.    Return in about 1 year (around 05/15/2024) for TBSE, Cyst Excision (scalp), Next Available.  I, Jill Parcell, CMA, am acting as scribe for Artemio Larry, MD.   Documentation: I have reviewed the above documentation for accuracy and completeness, and I agree with the above.  Artemio Larry, MD

## 2023-05-16 NOTE — Patient Instructions (Addendum)
 Recommend daily broad spectrum sunscreen SPF 30+ to sun-exposed areas, reapply every 2 hours as needed. Call for new or changing lesions.  Staying in the shade or wearing long sleeves, sun glasses (UVA+UVB protection) and wide brim hats (4-inch brim around the entire circumference of the hat) are also recommended for sun protection.      Melanoma ABCDEs  Melanoma is the most dangerous type of skin cancer, and is the leading cause of death from skin disease.  You are more likely to develop melanoma if you: Have light-colored skin, light-colored eyes, or red or blond hair Spend a lot of time in the sun Tan regularly, either outdoors or in a tanning bed Have had blistering sunburns, especially during childhood Have a close family member who has had a melanoma Have atypical moles or large birthmarks  Early detection of melanoma is key since treatment is typically straightforward and cure rates are extremely high if we catch it early.   The first sign of melanoma is often a change in a mole or a new dark spot.  The ABCDE system is a way of remembering the signs of melanoma.  A for asymmetry:  The two halves do not match. B for border:  The edges of the growth are irregular. C for color:  A mixture of colors are present instead of an even brown color. D for diameter:  Melanomas are usually (but not always) greater than 6mm - the size of a pencil eraser. E for evolution:  The spot keeps changing in size, shape, and color.  Please check your skin once per month between visits. You can use a small mirror in front and a large mirror behind you to keep an eye on the back side or your body.   If you see any new or changing lesions before your next follow-up, please call to schedule a visit.  Please continue daily skin protection including broad spectrum sunscreen SPF 30+ to sun-exposed areas, reapplying every 2 hours as needed when you're outdoors.   Staying in the shade or wearing long sleeves,  sun glasses (UVA+UVB protection) and wide brim hats (4-inch brim around the entire circumference of the hat) are also recommended for sun protection.         Pre-Operative Instructions  You are scheduled for a surgical procedure at [facility]. We recommend you read the following instructions. If you have any questions or concerns, please call the office at [phone].  Shower and wash the entire body with soap and water the day of your surgery paying special attention to cleansing at and around the planned surgery site.  Avoid aspirin or aspirin containing products at least fourteen (14) days prior to your surgical procedure and for at least one week (7 Days) after your surgical procedure. If you take aspirin on a regular basis for heart disease or history of stroke or for any other reason, we may recommend you continue taking aspirin but please notify us if you take this on a regular basis. Aspirin can cause more bleeding to occur during surgery as well as prolonged bleeding and bruising after surgery.   Avoid other nonsteroidal pain medications at least one week prior to surgery and at least one week prior to your surgery. These include medications such as Ibuprofen (Motrin, Advil and Nuprin), Naprosyn, Voltaren, Relafen, etc. If medications are used for therapeutic reasons, please inform us as they can cause increased bleeding or prolonged bleeding during and bruising after surgical procedures.  Please advise [place] if you are taking any "blood thinner" medications such as Coumadin or Dipyridamole or Plavix or similar medications. These cause increased bleeding and prolonged bleeding during procedures and bruising after surgical procedures. We may have to consider discontinuing these medications briefly prior to and shortly after your surgery if safe to do so.   Please inform us of all medications you are currently taking. All medications that are taken regularly should be taken the  day of surgery as you always do. Nevertheless, we need to be informed of what medications you are taking prior to surgery to know whether they will affect the procedure or cause any complications.   Please inform us of any medication allergies. Also inform us of whether you have allergies to Latex or rubber products or whether you have had any adverse reaction to Lidocaine or Epinephrine.  Please inform us of any prosthetic or artificial body parts such as artificial heart valve, joint replacements, etc., or similar condition that might require preoperative antibiotics.   We recommend avoidance of alcohol at least two weeks prior to surgery and continued avoidance for at least two weeks after surgery.   We recommend discontinuation of tobacco smoking at least two weeks prior to surgery and continued abstinence for at least two weeks after surgery.  Do not plan strenuous exercise, strenuous work or strenuous lifting for approximately four weeks after your surgery.   We request if you are unable to make your scheduled surgical appointment, please call us at least a week in advance or as soon as you are aware of a problem so that we can cancel or reschedule the appointment.   You MAY TAKE TYLENOL (acetaminophen) for pain as it is not a blood thinner.   PLEASE PLAN TO BE IN TOWN FOR TWO WEEKS FOLLOWING SURGERY, THIS IS IMPORTANT SO YOU CAN BE CHECKED FOR DRESSING CHANGES, SUTURE REMOVAL AND TO MONITOR FOR POSSIBLE COMPLICATIONS.      Due to recent changes in healthcare laws, you may see results of your pathology and/or laboratory studies on MyChart before the doctors have had a chance to review them. We understand that in some cases there may be results that are confusing or concerning to you. Please understand that not all results are received at the same time and often the doctors may need to interpret multiple results in order to provide you with the best plan of care or course of treatment.  Therefore, we ask that you please give [place] 2 business days to thoroughly review all your results before contacting the office for clarification. Should we see a critical lab result, you will be contacted sooner.   If You Need Anything After Your Visit  If you have any questions or concerns for your doctor, please call our main line at [phone] and press option 4 to reach your doctor's medical assistant. If no one answers, please leave a voicemail as directed and we will return your call as soon as possible. Messages left after 4 pm will be answered the following business day.   You may also send [place] a message via MyChart. We typically respond to MyChart messages within 1-2 business days.  For prescription refills, please ask your pharmacy to contact our office. Our fax number is [phone].  If you have an urgent issue when the clinic is closed that cannot wait until the next business day, you can page your doctor at the number below.    Please note that while we do  our best to be available for urgent issues outside of office hours, we are not available 24/7.   If you have an urgent issue and are unable to reach [place], you may choose to seek medical care at your doctor's office, retail clinic, urgent care center, or emergency room.  If you have a medical emergency, please immediately call 911 or go to the emergency department.  Pager Numbers  - Dr. Gwen Pounds: [phone]  - Dr. Roseanne Reno: [phone]  - Dr. Katrinka Blazing: [phone]   In the event of inclement weather, please call our main line at [phone] for an update on the status of any delays or closures.  Dermatology Medication Tips: Please keep the boxes that topical medications come in in order to help keep track of the instructions about where and how to use these. Pharmacies typically print the medication instructions only on the boxes and not directly on the medication tubes.   If your medication is too expensive, please  contact our office at [phone] option 4 or send [place] a message through MyChart.   We are unable to tell what your co-pay for medications will be in advance as this is different depending on your insurance coverage. However, we may be able to find a substitute medication at lower cost or fill out paperwork to get insurance to cover a needed medication.   If a prior authorization is required to get your medication covered by your insurance company, please allow [place] 1-2 business days to complete this process.  Drug prices often vary depending on where the prescription is filled and some pharmacies may offer cheaper prices.  The website www.goodrx.com contains coupons for medications through different pharmacies. The prices here do not account for what the cost may be with help from insurance (it may be cheaper with your insurance), but the website can give you the price if you did not use any insurance.  - You can print the associated coupon and take it with your prescription to the pharmacy.  - You may also stop by our office during regular business hours and pick up a GoodRx coupon card.  - If you need your prescription sent electronically to a different pharmacy, notify our office through [facility] or by phone at [phone] option 4.     Si Usted Necesita Algo Despus de Su Visita  Tambin puede enviarnos un mensaje a travs de Clinical cytogeneticist. Por lo general respondemos a los mensajes de MyChart en el transcurso de 1 a 2 das hbiles.  Para renovar recetas, por favor pida a su farmacia que se ponga en contacto con nuestra oficina. Annie Sable de fax es [place] [phone].  Si tiene un asunto urgente cuando la clnica est cerrada y que no puede esperar hasta el siguiente da hbil, puede llamar/localizar a su doctor(a) al nmero que aparece a continuacin.   Por favor, tenga en cuenta que aunque hacemos todo lo posible para estar disponibles para asuntos urgentes fuera del horario de  [place], no estamos disponibles las 24 horas del da, los 7 [address]  [address] de la [place].   Si tiene un problema urgente y no puede comunicarse con nosotros, puede optar por buscar atencin mdica  en el consultorio de su doctor(a), en una clnica privada, en un centro de atencin urgente o en una sala de emergencias.  Si tiene Engineer, drilling, por favor llame inmediatamente al 911 o vaya a la sala de emergencias.  Nmeros de bper  - Dr. Gwen Pounds: [phone]  - Dra. Roseanne Reno:  [phone]  - Dr. Katrinka Blazing: [phone]   En caso de inclemencias del tiempo, por favor llame a Lacy Duverney principal al [phone] para una actualizacin sobre el [place] de cualquier retraso o cierre.  Consejos para la medicacin en dermatologa: Por favor, guarde las cajas en las que vienen los medicamentos de uso tpico para ayudarle a seguir las instrucciones sobre dnde y cmo usarlos. Las farmacias generalmente imprimen las instrucciones del medicamento slo en las cajas y no directamente en los tubos del [place].   Si su medicamento es muy caro, por favor, pngase en contacto con Rolm Gala llamando al [phone] y presione la opcin 4 o envenos un mensaje a travs de Clinical cytogeneticist.   No podemos decirle cul ser su copago por los medicamentos por adelantado ya que esto es diferente dependiendo de la cobertura de su seguro. Sin embargo, es posible que podamos encontrar un medicamento sustituto a Audiological scientist un formulario para que el seguro cubra el medicamento que se considera necesario.   Si se requiere una autorizacin previa para que su compaa de seguros [place] su medicamento, por favor permtanos de 1 a 2 das hbiles para completar [address]  Los precios de los medicamentos varan con frecuencia dependiendo del Environmental consultant de dnde se surte la receta y alguna farmacias pueden ofrecer precios ms baratos.  El sitio web www.goodrx.com tiene cupones para medicamentos de Health and safety inspector.  Los precios aqu no tienen en cuenta lo que podra costar con la ayuda del seguro (puede ser ms barato con su seguro), pero el sitio web puede darle el precio si no utiliz Tourist information centre manager.  - Puede imprimir el cupn correspondiente y llevarlo con su receta a la farmacia.  - Tambin puede pasar por nuestra oficina durante el horario de atencin regular y Education officer, museum una tarjeta de cupones de GoodRx.  - Si necesita que su receta se enve electrnicamente a una farmacia diferente, informe a nuestra oficina a travs de MyChart de [place] o por telfono llamando al [phone] y presione la opcin 4.

## 2023-05-19 ENCOUNTER — Encounter: Payer: Self-pay | Admitting: Internal Medicine

## 2023-05-19 ENCOUNTER — Ambulatory Visit (INDEPENDENT_AMBULATORY_CARE_PROVIDER_SITE_OTHER): Payer: 59 | Admitting: Internal Medicine

## 2023-05-19 VITALS — BP 122/80 | HR 75 | Ht 63.0 in | Wt 144.0 lb

## 2023-05-19 DIAGNOSIS — E66811 Obesity, class 1: Secondary | ICD-10-CM | POA: Diagnosis not present

## 2023-05-19 DIAGNOSIS — Z79899 Other long term (current) drug therapy: Secondary | ICD-10-CM | POA: Diagnosis not present

## 2023-05-19 DIAGNOSIS — I10 Essential (primary) hypertension: Secondary | ICD-10-CM | POA: Diagnosis not present

## 2023-05-19 MED ORDER — ALPRAZOLAM 0.25 MG PO TABS: 0.2500 mg | ORAL_TABLET | Freq: Every evening | ORAL | 0 refills | Status: AC | PRN

## 2023-05-19 MED ORDER — CITALOPRAM HYDROBROMIDE 10 MG PO TABS: 10.0000 mg | ORAL_TABLET | Freq: Every day | ORAL | 1 refills | Status: DC

## 2023-05-19 MED ORDER — PHENTERMINE HCL 37.5 MG PO TABS: ORAL_TABLET | ORAL | 5 refills | Status: DC

## 2023-05-19 MED ORDER — FAMOTIDINE 20 MG PO TABS: 20.0000 mg | ORAL_TABLET | Freq: Two times a day (BID) | ORAL | 1 refills | Status: DC

## 2023-05-19 NOTE — Patient Instructions (Addendum)
 Ashlee Graves is overdue for her cervical cancer screening. Please schedule a CPE on your way out today    For your allergies ,  You can use HYDROXYZINE   but you should also consider adding one of these newer second generation antihistamines that are longer acting, non sedating and  available OTC:  Generic  Zyrtec , which is cetirizine .    generic Allegra  , available generically as fexofenadine  ; comes in 60 mg and 180 mg once daily strengths.    Generic Claritin :  also available as loratidine .    FOR THE EYES; YOU CAN USE AZELASTINE  OR PATANOL    Look fo these shaeks on her list of covered items:  Premier protein (Equate version  available at [employer] as well) Fairlife Atkins  Muscle Milk

## 2023-05-19 NOTE — Progress Notes (Signed)
 Subjective:  Patient ID: Ashlee Graves, female    DOB: 1985/05/14  Age: 38 y.o. MRN: [id number]  CC: The primary encounter diagnosis was Essential hypertension. Diagnoses of Long-term current use of high risk medication other than anticoagulant and Obesity (BMI 30.0-34.9) were also pertinent to this visit.   HPI Ashlee Graves presents for  Chief Complaint  Patient presents with   Medical Management of Chronic Issues    6 month follow up    [name] is a developmentally delayed 38 yr old female with an eating disorder who is accompanied by her mother for follow up on pharmacotherapy for weight management.  She appears well today and has lost 9 lbs since October using phenterine twice daily to suppress appetie.  She is engaging in physical activity several times per week.  She denies elevated BP, tachycardia  and insomina  LOSING WEIGHT ON PHENTERMINE  and   KETO DIET   Outpatient Medications Prior to Visit  Medication Sig Dispense Refill   Biotin 1000 MCG tablet Take 1,000 mcg by mouth 3 (three) times daily. Take one daily     fluticasone  (FLONASE ) 50 MCG/ACT nasal spray Place 2 sprays into both nostrils daily. 16 g 6   hydroquinone  4 % cream Apply topically 2 (two) times daily. To dark patches at face for up to 3 months. 28.35 g 2   hydrOXYzine  (ATARAX ) 25 MG tablet TAKE 1 TABLET BY MOUTH 3 TIMES DAILY AS NEEDED FOR ITCHING. 270 tablet 1   Multiple Vitamins-Minerals (HAIR SKIN AND NAILS FORMULA PO) Take by mouth. Two two daily     Multiple Vitamins-Minerals (HAIR VITAMINS PO) Take by mouth daily.      Norgestimate-Ethinyl Estradiol Triphasic (TRI-ESTARYLLA) 0.18/0.215/0.25 MG-35 MCG tablet Take 1 tablet by mouth daily. 84 tablet 3   telmisartan  (MICARDIS ) 40 MG tablet TAKE ONE TABLET BY MOUTH AT BEDTIME 90 tablet 1   triamcinolone  ointment (KENALOG ) 0.5 % Apply 1 Application topically 2 (two) times daily. 30 g 0   citalopram  (CELEXA ) 10 MG tablet TAKE ONE TABLET BY MOUTH ONE TIME DAILY 30  tablet 0   famotidine  (PEPCID ) 20 MG tablet TAKE ONE TABLET BY MOUTH TWICE A DAY 60 tablet 0   phentermine  (ADIPEX-P ) 37.5 MG tablet TAKE ONE-HALF TABLET BY MOUTH TWICE A DAY AS NEEDED 30 tablet 0   No facility-administered medications prior to visit.    Review of Systems;  Patient denies headache, fevers, malaise, unintentional weight loss, skin rash, eye pain, sinus congestion and sinus pain, sore throat, dysphagia,  hemoptysis , cough, dyspnea, wheezing, chest pain, palpitations, orthopnea, edema, abdominal pain, nausea, melena, diarrhea, constipation, flank pain, dysuria, hematuria, urinary  Frequency, nocturia, numbness, tingling, seizures,  Focal weakness, Loss of consciousness,  Tremor, insomnia, depression, anxiety, and suicidal ideation.      Objective:  BP 122/80   Pulse 75   Ht 5\' 3"  (1.6 m)   Wt 144 lb (65.3 kg)   SpO2 98%   BMI 25.51 kg/m   BP Readings from Last 3 Encounters:  05/19/23 122/80  11/14/22 114/80  06/27/22 104/76    Wt Readings from Last 3 Encounters:  05/19/23 144 lb (65.3 kg)  11/14/22 153 lb 12.8 oz (69.8 kg)  08/10/22 164 lb (74.4 kg)    Physical Exam Vitals reviewed.  Constitutional:      General: She is not in acute distress.    Appearance: Normal appearance. She is normal weight. She is not ill-appearing, toxic-appearing or diaphoretic.  HENT:  Head: Normocephalic.  Eyes:     General: No scleral icterus.       Right eye: No discharge.        Left eye: No discharge.     Conjunctiva/sclera: Conjunctivae normal.  Cardiovascular:     Rate and Rhythm: Normal rate and regular rhythm.     Heart sounds: Normal heart sounds.  Pulmonary:     Effort: Pulmonary effort is normal. No respiratory distress.     Breath sounds: Normal breath sounds.  Musculoskeletal:        General: Normal range of motion.  Skin:    General: Skin is warm and dry.  Neurological:     General: No focal deficit present.     Mental Status: She is alert and  oriented to person, place, and time. Mental status is at baseline.  Psychiatric:        Mood and Affect: Mood normal.        Behavior: Behavior normal.        Thought Content: Thought content normal.        Judgment: Judgment normal.   Lab Results  Component Value Date   HGBA1C 5.3 06/27/2022   HGBA1C 5.2 12/24/2021   HGBA1C 5.3 09/23/2020    Lab Results  Component Value Date   CREATININE 0.66 07/15/2022   CREATININE 0.69 06/27/2022   CREATININE 0.70 12/24/2021    Lab Results  Component Value Date   WBC 7.6 06/27/2022   HGB 12.5 06/27/2022   HCT 38.4 06/27/2022   PLT 320.0 06/27/2022   GLUCOSE 89 07/15/2022   CHOL 170 06/27/2022   TRIG 118.0 06/27/2022   HDL 46.00 06/27/2022   LDLDIRECT 125.0 06/27/2022   LDLCALC 100 (H) 06/27/2022   ALT 9 11/14/2022   AST 19 11/14/2022   NA 137 07/15/2022   K 4.2 07/15/2022   CL 102 07/15/2022   CREATININE 0.66 07/15/2022   BUN 11 07/15/2022   CO2 29 07/15/2022   TSH 1.50 12/24/2021   HGBA1C 5.3 06/27/2022   MICROALBUR 5 09/23/2020    US  Abdomen Limited RUQ (LIVER/GB) Result Date: 08/05/2022 CLINICAL DATA:  Elevated liver enzymes. EXAM: ULTRASOUND ABDOMEN LIMITED RIGHT UPPER QUADRANT COMPARISON:  September 13, 2019 FINDINGS: Gallbladder: Gallstones are noted, largest measures 9.6 mm. No wall thickening visualized. No sonographic Murphy sign noted by sonographer. Common bile duct: Diameter: 6.3 mm Liver: No focal lesion identified. Increased echotexture. Portal vein is patent on color Doppler imaging with normal direction of blood flow towards the liver. Other: None. IMPRESSION: 1. Cholelithiasis without sonographic evidence of acute cholecystitis. 2. Fatty infiltration of the liver. Electronically Signed   By: Anna Barnes M.D.   On: 08/05/2022 09:59    Assessment & Plan:  .Essential hypertension -     Comprehensive metabolic panel with GFR; Future  Long-term current use of high risk medication other than anticoagulant -     TSH;  Future -     CBC with Differential/Platelet; Future  Obesity (BMI 30.0-34.9) Assessment & Plan: She is losing weight on phentermine , which was resumed when GLP 1 ago . agonist  therapy  was unaffordable .  She has lowered her BMI to 25  and has been advised to reduce dose to once daily for BMI 23   Orders: -     Comprehensive metabolic panel with GFR; Future -     Lipid panel; Future -     LDL cholesterol, direct; Future  Other orders -     Citalopram   Hydrobromide; Take 1 tablet (10 mg total) by mouth daily.  Dispense: 30 tablet; Refill: 1 -     Famotidine ; Take 1 tablet (20 mg total) by mouth 2 (two) times daily.  Dispense: 60 tablet; Refill: 1 -     ALPRAZolam; Take 1 tablet (0.25 mg total) by mouth at bedtime as needed for anxiety.  Dispense: 5 tablet; Refill: 0 -     Phentermine  HCl; TAKE ONE-HALF TABLET BY MOUTH TWICE A DAY AS NEEDED  Dispense: 30 tablet; Refill: 5     I spent 30  minutes on the day of this face to face encounter reviewing patient's   prior relevant surgical and non surgical procedures, recent  labs and imaging studies, counseling on weight management,  reviewing the assessment and plan with patient, and post visit ordering and reviewing of  diagnostics and therapeutics with patient  .   Follow-up: Return in about 3 months (around 08/18/2023) for physical.   Thersia Flax, MD

## 2023-05-21 NOTE — Assessment & Plan Note (Signed)
 She is losing weight on phentermine , which was resumed when GLP 1 ago . agonist  therapy  was unaffordable .  She has lowered her BMI to 25  and has been advised to reduce dose to once daily for BMI 23

## 2023-06-23 ENCOUNTER — Telehealth: Payer: Self-pay | Admitting: Internal Medicine

## 2023-06-23 NOTE — Telephone Encounter (Unsigned)
 Copied from CRM [id number]. Topic: Referral - Question >> Jun 23, 2023 12:58 PM Clyde Darling P wrote: Reason for CRM: Pt mom Jenette Mitchell would like to know recommendation of where pt could get mammogram - pt mom can be reached [phone]

## 2023-08-11 ENCOUNTER — Other Ambulatory Visit: Payer: Self-pay | Admitting: Internal Medicine

## 2023-08-14 ENCOUNTER — Other Ambulatory Visit: Payer: Self-pay

## 2023-08-14 MED ORDER — NORGESTIM-ETH ESTRAD TRIPHASIC 0.18/0.215/0.25 MG-35 MCG PO TABS: 1.0000 | ORAL_TABLET | Freq: Every day | ORAL | 3 refills | Status: AC

## 2023-08-24 ENCOUNTER — Encounter: Payer: Self-pay | Admitting: Internal Medicine

## 2023-08-24 ENCOUNTER — Ambulatory Visit: Admitting: Internal Medicine

## 2023-08-24 ENCOUNTER — Other Ambulatory Visit (HOSPITAL_COMMUNITY): Payer: Self-pay

## 2023-08-24 ENCOUNTER — Telehealth: Payer: Self-pay

## 2023-08-24 VITALS — BP 118/78 | HR 90 | Temp 98.0°F | Ht 63.0 in | Wt 140.6 lb

## 2023-08-24 DIAGNOSIS — E66811 Obesity, class 1: Secondary | ICD-10-CM | POA: Diagnosis not present

## 2023-08-24 DIAGNOSIS — Z0000 Encounter for general adult medical examination without abnormal findings: Secondary | ICD-10-CM | POA: Diagnosis not present

## 2023-08-24 DIAGNOSIS — Z124 Encounter for screening for malignant neoplasm of cervix: Secondary | ICD-10-CM

## 2023-08-24 DIAGNOSIS — Z79899 Other long term (current) drug therapy: Secondary | ICD-10-CM

## 2023-08-24 DIAGNOSIS — Z113 Encounter for screening for infections with a predominantly sexual mode of transmission: Secondary | ICD-10-CM

## 2023-08-24 DIAGNOSIS — I10 Essential (primary) hypertension: Secondary | ICD-10-CM

## 2023-08-24 LAB — LIPID PANEL
Cholesterol: 179 mg/dL (ref 0–200)
HDL: 59.9 mg/dL (ref >39.00)
LDL Cholesterol: 104 mg/dL — ABNORMAL HIGH (ref 0–99)
NonHDL: 118.74
Total CHOL/HDL Ratio: 3
Triglycerides: 76 mg/dL (ref 0.0–149.0)
VLDL: 15.2 mg/dL (ref 0.0–40.0)

## 2023-08-24 MED ORDER — HYDROXYZINE HCL 25 MG PO TABS: ORAL_TABLET | ORAL | 1 refills | Status: AC

## 2023-08-24 MED ORDER — TELMISARTAN 40 MG PO TABS: 40.0000 mg | ORAL_TABLET | Freq: Every day | ORAL | 1 refills | Status: AC

## 2023-08-24 MED ORDER — FAMOTIDINE 20 MG PO TABS: 20.0000 mg | ORAL_TABLET | Freq: Two times a day (BID) | ORAL | 1 refills | Status: DC

## 2023-08-24 MED ORDER — MOMETASONE FUROATE 0.1 % EX CREA: TOPICAL_CREAM | CUTANEOUS | 1 refills | Status: AC

## 2023-08-24 MED ORDER — FLUTICASONE PROPIONATE 50 MCG/ACT NA SUSP: 2.0000 | Freq: Every day | NASAL | 6 refills | Status: AC

## 2023-08-24 NOTE — Telephone Encounter (Signed)
 PLEASE BE ADVISED Clinical questions have been answered and PA submitted.TO PLAN. PA currently Pending.

## 2023-08-24 NOTE — Patient Instructions (Signed)
 Please call the [facility] facility and find out if the actual mammogram is done by a man or by a female Psychiatrist.  If not,  I will ask my staff to start the search for a [place] facility covered by [employer]

## 2023-08-24 NOTE — Assessment & Plan Note (Signed)
 Secondary to habitual overeating complicated by cognitive issues.  She is losing weight on phentermine , which was resumed when GLP 1 ago . agonist  therapy  was unaffordable .

## 2023-08-24 NOTE — Progress Notes (Unsigned)
 Patient ID: Ashlee Graves, female    DOB: Aug 17, 1985  Age: 38 y.o. MRN: [id number]  The patient is here for annual preventive examination and management of other chronic and acute problems.   The risk factors are reflected in the social history.   The roster of all physicians providing medical care to patient - is listed in the Snapshot section of the chart.   Activities of daily living:  The patient is 100% independent in all ADLs: dressing, toileting, feeding as well as independent mobility   Home safety : The patient has smoke detectors in the home. They wear seatbelts.  There are no unsecured firearms at home. There is no violence in the home.    There is no risks for hepatitis, STDs or HIV. There is no   history of blood transfusion. They have no travel history to infectious disease endemic areas of the world.   The patient has seen their dentist in the last six month. They have seen their eye doctor in the last year. The patinet  denies slight hearing difficulty with regard to whispered voices and some television programs.  They have deferred audiologic testing in the last year.  They do not  have excessive sun exposure. Discussed the need for sun protection: hats, long sleeves and use of sunscreen if there is significant sun exposure.    Diet: the importance of a healthy diet is discussed. They do have a healthy diet.   The benefits of regular aerobic exercise were discussed. The patient  exercises  3 to 5 days per week  for  60 minutes.    Depression screen: there are no signs or vegative symptoms of depression- irritability, change in appetite, anhedonia, sadness/tearfullness.   The following portions of the patient's history were reviewed and updated as appropriate: allergies, current medications, past family history, past medical history,  past surgical history, past social history  and problem list.   Visual acuity was not assessed per patient preference since the patient has  regular follow up with an  ophthalmologist. Hearing and body mass index were assessed and reviewed.    During the course of the visit the patient was educated and counseled about appropriate screening and preventive services including : fall prevention , diabetes screening, nutrition counseling, colorectal cancer screening, and recommended immunizations.    Chief Complaint:    None .Ashlee  Graves Is accompanied by her mother She    Review of Symptoms  Patient denies headache, fevers, malaise, unintentional weight loss, skin rash, eye pain, sinus congestion and sinus pain, sore throat, dysphagia,  hemoptysis , cough, dyspnea, wheezing, chest pain, palpitations, orthopnea, edema, abdominal pain, nausea, melena, diarrhea, constipation, flank pain, dysuria, hematuria, urinary  Frequency, nocturia, numbness, tingling, seizures,  Focal weakness, Loss of consciousness,  Tremor, insomnia, depression, anxiety, and suicidal ideation.    Physical Exam:  BP 118/78 (BP Location: Left Arm, Patient Position: Sitting, Cuff Size: Normal)   Pulse 90   Temp 98 F (36.7 C) (Oral)   Ht 5' 3 (1.6 m)   Wt 140 lb 9.6 oz (63.8 kg)   SpO2 97%   BMI 24.91 kg/m    Physical Exam Vitals reviewed. Exam conducted with a chaperone present.  Constitutional:      General: She is not in acute distress.    Appearance: Normal appearance. She is well-developed and normal weight. She is not ill-appearing, toxic-appearing or diaphoretic.  HENT:     Head: Normocephalic.     Right Ear: Tympanic  membrane, ear canal and external ear normal. There is no impacted cerumen.     Left Ear: Tympanic membrane, ear canal and external ear normal. There is no impacted cerumen.     Nose: Nose normal.     Mouth/Throat:     Mouth: Mucous membranes are moist.     Pharynx: Oropharynx is clear.  Eyes:     General: No scleral icterus.       Right eye: No discharge.        Left eye: No discharge.     Conjunctiva/sclera: Conjunctivae normal.      Pupils: Pupils are equal, round, and reactive to light.  Neck:     Thyroid : No thyromegaly.     Vascular: No carotid bruit or JVD.  Cardiovascular:     Rate and Rhythm: Normal rate and regular rhythm.     Heart sounds: Normal heart sounds.  Pulmonary:     Effort: Pulmonary effort is normal. No respiratory distress.     Breath sounds: Normal breath sounds.  Chest:  Breasts:    Breasts are symmetrical.     Right: Normal. No swelling, inverted nipple, mass, nipple discharge, skin change or tenderness.     Left: Normal. No swelling, inverted nipple, mass, nipple discharge, skin change or tenderness.  Abdominal:     General: Bowel sounds are normal.     Palpations: Abdomen is soft. There is no mass.     Tenderness: There is no abdominal tenderness. There is no guarding or rebound.     Hernia: A hernia is present. Hernia is present in the right inguinal area. There is no hernia in the left inguinal area.  Genitourinary:    Exam position: Lithotomy position.     Pubic Area: No rash or pubic lice.      Labia:        Right: No rash, tenderness, lesion or injury.        Left: No rash, tenderness, lesion or injury.      Vagina: Normal.     Cervix: Normal.     Uterus: Normal.      Adnexa: Right adnexa normal and left adnexa normal.     Comments: Blod visiable at the os.  Speculm exaa attempted but unsuccesful  Musculoskeletal:        General: Normal range of motion.     Cervical back: Normal range of motion and neck supple.  Lymphadenopathy:     Cervical: No cervical adenopathy.     Upper Body:     Right upper body: No supraclavicular, axillary or pectoral adenopathy.     Left upper body: No supraclavicular, axillary or pectoral adenopathy.     Lower Body: No right inguinal adenopathy. No left inguinal adenopathy.  Skin:    General: Skin is warm and dry.  Neurological:     General: No focal deficit present.     Mental Status: She is alert and oriented to person, place, and time.  Mental status is at baseline.  Psychiatric:        Mood and Affect: Mood normal.        Behavior: Behavior normal.        Thought Content: Thought content normal.        Judgment: Judgment normal.     Assessment and Plan: Screening examination for STD (sexually transmitted disease) -     Urine cytology ancillary only  Cervical cancer screening Assessment & Plan: Pelvic exam was again attempted but even with pre  treatment with alprazolam  she was unable to relax enough to allow a pediatric speculum.  She is NOT sexually active per mother.  Will screen for STDS with urine sample    Obesity (BMI 30.0-34.9) Assessment & Plan: Secondary to habitual overeating complicated by cognitive issues.  She is losing weight on phentermine , which was resumed when GLP 1 ago . agonist  therapy  was unaffordable .   Orders: -     LDL cholesterol, direct -     Lipid panel -     Comprehensive metabolic panel with GFR  Long-term current use of high risk medication other than anticoagulant Assessment & Plan: She has been unable to maintain a healthy weight without use of phentermine  and has developed fatty liver.  Continue use of phentermine   for now .  Orders: -     CBC with Differential/Platelet -     TSH  Essential hypertension -     Comprehensive metabolic panel with GFR  Mild perinatal anoxic-ischemic brain injury Assessment & Plan: Occurred at birth.  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    Visit for preventive health examination Assessment & Plan: age appropriate education and counseling updated, referrals for preventative services and immunizations addressed, dietary and smoking counseling addressed, most recent labs reviewed.  I have personally reviewed and have noted:   1) the patient's medical and social history 2) The pt's use of  alcohol, tobacco, and illicit drugs 3) The patient's current medications and supplements 4) Functional ability including ADL's, fall risk, home safety risk, hearing and visual impairment 5) Diet and physical activities 6) Evidence for depression or mood disorder 7) The patient's height, weight, and BMI have been recorded in the chart  I have made referrals, and provided counseling and education based on review of the above    Other orders -     Famotidine ; Take 1 tablet (20 mg total) by mouth 2 (two) times daily.  Dispense: 60 tablet; Refill: 1 -     Fluticasone  Propionate; Place 2 sprays into both nostrils daily.  Dispense: 16 g; Refill: 6 -     Telmisartan ; Take 1 tablet (40 mg total) by mouth at bedtime.  Dispense: 90 tablet; Refill: 1 -     hydrOXYzine  HCl; TAKE 1 TABLET BY MOUTH 3 TIMES DAILY AS NEEDED FOR ITCHING.  Dispense: 270 tablet; Refill: 1 -     Mometasone  Furoate; Use as needed for itching in ear  Dispense: 15 g; Refill: 1    Return in about 6 months (around 02/24/2024).  Verneita LITTIE Kettering, MD

## 2023-08-24 NOTE — Assessment & Plan Note (Signed)
 Pelvic exam was again attempted but even with pre treatment with alprazolam  she was unable to relax enough to allow a pediatric speculum.  She is NOT sexually active per mother.  Will screen for STDS with urine sample

## 2023-08-24 NOTE — Telephone Encounter (Signed)
 Pharmacy Patient Advocate Encounter   Received notification from CoverMyMeds that prior authorization for Telmisartan  40MG  tablets  is required/requested.   Insurance verification completed.   The patient is insured through [facility] .   Per test claim: PA required; PA started via CoverMyMeds. KEY [id number] . Waiting for clinical questions to populate.

## 2023-08-25 ENCOUNTER — Encounter: Payer: Self-pay | Admitting: Internal Medicine

## 2023-08-25 ENCOUNTER — Other Ambulatory Visit (HOSPITAL_COMMUNITY): Payer: Self-pay

## 2023-08-25 DIAGNOSIS — Z0000 Encounter for general adult medical examination without abnormal findings: Secondary | ICD-10-CM | POA: Insufficient documentation

## 2023-08-25 LAB — CBC WITH DIFFERENTIAL/PLATELET
Absolute Lymphocytes: 2499 {cells}/uL (ref 850–3900)
Absolute Monocytes: 644 {cells}/uL (ref 200–950)
Basophils Absolute: 49 {cells}/uL (ref 0–200)
Basophils Relative: 0.7 %
Eosinophils Absolute: 420 {cells}/uL (ref 15–500)
Eosinophils Relative: 6 %
HCT: 40.9 % (ref 35.0–45.0)
Hemoglobin: 13 g/dL (ref 11.7–15.5)
MCH: 29.1 pg (ref 27.0–33.0)
MCHC: 31.8 g/dL — ABNORMAL LOW (ref 32.0–36.0)
MCV: 91.7 fL (ref 80.0–100.0)
MPV: 10 fL (ref 7.5–12.5)
Monocytes Relative: 9.2 %
Neutro Abs: 3388 {cells}/uL (ref 1500–7800)
Neutrophils Relative %: 48.4 %
Platelets: 364 Thousand/uL (ref 140–400)
RBC: 4.46 Million/uL (ref 3.80–5.10)
RDW: 12.5 % (ref 11.0–15.0)
Total Lymphocyte: 35.7 %
WBC: 7 Thousand/uL (ref 3.8–10.8)

## 2023-08-25 LAB — COMPREHENSIVE METABOLIC PANEL WITH GFR
AG Ratio: 1.4 (calc) (ref 1.0–2.5)
ALT: 8 U/L (ref 6–29)
AST: 15 U/L (ref 10–30)
Albumin: 3.8 g/dL (ref 3.6–5.1)
Alkaline phosphatase (APISO): 42 U/L (ref 31–125)
BUN: 8 mg/dL (ref 7–25)
CO2: 28 mmol/L (ref 20–32)
Calcium: 9.2 mg/dL (ref 8.6–10.2)
Chloride: 102 mmol/L (ref 98–110)
Creat: 0.66 mg/dL (ref 0.50–0.99)
Globulin: 2.7 g/dL (ref 1.9–3.7)
Glucose, Bld: 73 mg/dL (ref 65–99)
Potassium: 4.9 mmol/L (ref 3.5–5.3)
Sodium: 138 mmol/L (ref 135–146)
Total Bilirubin: 0.3 mg/dL (ref 0.2–1.2)
Total Protein: 6.5 g/dL (ref 6.1–8.1)
eGFR: 114 mL/min/1.73m2 (ref >=60)

## 2023-08-25 LAB — LDL CHOLESTEROL, DIRECT: Direct LDL: 121 mg/dL — ABNORMAL HIGH (ref <100)

## 2023-08-25 LAB — TSH: TSH: 2.07 m[IU]/L

## 2023-08-25 NOTE — Telephone Encounter (Signed)
 Per the insurance, a prior shara is not required. I spoke with the pharmacy and it is too early to fill. They have it scheduled to fill on 08/29/23.

## 2023-08-25 NOTE — Assessment & Plan Note (Signed)
 Occurred at birth.  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

## 2023-08-25 NOTE — Assessment & Plan Note (Signed)

## 2023-08-25 NOTE — Assessment & Plan Note (Signed)
 She has been unable to maintain a healthy weight without use of phentermine  and has developed fatty liver.  Continue use of phentermine   for now .

## 2023-08-27 ENCOUNTER — Ambulatory Visit: Payer: Self-pay | Admitting: Internal Medicine

## 2023-10-09 ENCOUNTER — Other Ambulatory Visit: Payer: Self-pay | Admitting: Internal Medicine

## 2023-10-09 DIAGNOSIS — Z1231 Encounter for screening mammogram for malignant neoplasm of breast: Secondary | ICD-10-CM

## 2023-10-16 ENCOUNTER — Other Ambulatory Visit: Payer: Self-pay | Admitting: Internal Medicine

## 2023-10-19 ENCOUNTER — Ambulatory Visit

## 2023-10-19 ENCOUNTER — Ambulatory Visit
Admission: RE | Admit: 2023-10-19 | Discharge: 2023-10-19 | Disposition: A | Discharge status: Home / Self Care | Source: Ambulatory Visit | Attending: Internal Medicine | Admitting: Internal Medicine

## 2023-10-19 DIAGNOSIS — Z1231 Encounter for screening mammogram for malignant neoplasm of breast: Secondary | ICD-10-CM

## 2023-10-24 ENCOUNTER — Other Ambulatory Visit: Payer: Self-pay | Admitting: Internal Medicine

## 2023-10-24 ENCOUNTER — Ambulatory Visit: Payer: Self-pay | Admitting: Internal Medicine

## 2023-10-24 DIAGNOSIS — R928 Other abnormal and inconclusive findings on diagnostic imaging of breast: Secondary | ICD-10-CM

## 2023-10-27 ENCOUNTER — Ambulatory Visit
Admission: RE | Admit: 2023-10-27 | Discharge: 2023-10-27 | Disposition: A | Discharge status: Home / Self Care | Source: Ambulatory Visit | Attending: Internal Medicine | Admitting: Internal Medicine

## 2023-10-27 ENCOUNTER — Other Ambulatory Visit: Payer: Self-pay | Admitting: Internal Medicine

## 2023-10-27 DIAGNOSIS — R928 Other abnormal and inconclusive findings on diagnostic imaging of breast: Secondary | ICD-10-CM | POA: Diagnosis not present

## 2023-10-27 DIAGNOSIS — N6320 Unspecified lump in the left breast, unspecified quadrant: Secondary | ICD-10-CM | POA: Diagnosis not present

## 2023-11-01 ENCOUNTER — Other Ambulatory Visit: Payer: Self-pay | Admitting: Internal Medicine

## 2023-11-09 ENCOUNTER — Other Ambulatory Visit: Payer: Self-pay | Admitting: Internal Medicine

## 2023-11-09 DIAGNOSIS — N63 Unspecified lump in unspecified breast: Secondary | ICD-10-CM

## 2023-12-04 ENCOUNTER — Other Ambulatory Visit: Payer: Self-pay | Admitting: Internal Medicine

## 2023-12-05 NOTE — Telephone Encounter (Signed)
 Refilled: 05/19/2023 Last OV: 08/24/2023 Next OV: 02/26/2024

## 2023-12-06 ENCOUNTER — Telehealth: Payer: Self-pay

## 2023-12-06 NOTE — Telephone Encounter (Signed)
 Copied from CRM [id number]. Topic: Clinical - Prescription Issue >> Dec 06, 2023  9:16 AM Viola FALCON wrote: Reason for CRM: [facility] called regarding the phentermine  (ADIPEX-P ) 37.5 MG tablet - it's a controlled substance and they weren't able to validate the electronic signature. Please call to give the verbal okay for medication to get filled [phone]

## 2023-12-06 NOTE — Telephone Encounter (Signed)
 Spoke to [employer] @ [facility], gave verbal to fill rx sent in 12/05/23

## 2023-12-19 ENCOUNTER — Other Ambulatory Visit: Payer: Self-pay | Admitting: Internal Medicine

## 2024-01-12 ENCOUNTER — Other Ambulatory Visit: Payer: Self-pay | Admitting: Internal Medicine

## 2024-02-26 ENCOUNTER — Ambulatory Visit: Admitting: Internal Medicine

## 2024-03-05 ENCOUNTER — Ambulatory Visit: Admitting: Internal Medicine

## 2024-05-03 ENCOUNTER — Other Ambulatory Visit

## 2024-05-03 ENCOUNTER — Encounter

## 2024-05-09 ENCOUNTER — Other Ambulatory Visit

## 2024-05-09 ENCOUNTER — Encounter

## 2024-05-28 ENCOUNTER — Encounter: Admitting: Dermatology
